# Patient Record
Sex: Female | Born: 1945 | Race: White | Hispanic: No | Marital: Single | Smoking: Former smoker
Health system: Southern US, Community
[De-identification: ages and names within clinical notes are randomized; demographics above are authoritative.]

## PROBLEM LIST (undated history)

## (undated) DIAGNOSIS — I10 Essential (primary) hypertension: Secondary | ICD-10-CM

## (undated) DIAGNOSIS — J439 Emphysema, unspecified: Secondary | ICD-10-CM

## (undated) DIAGNOSIS — M1990 Unspecified osteoarthritis, unspecified site: Secondary | ICD-10-CM

---

## 2019-03-24 ENCOUNTER — Emergency Department (HOSPITAL_COMMUNITY): Payer: Medicare Other

## 2019-03-24 ENCOUNTER — Inpatient Hospital Stay (HOSPITAL_COMMUNITY): Payer: Medicare Other

## 2019-03-24 ENCOUNTER — Inpatient Hospital Stay (HOSPITAL_COMMUNITY)
Admission: EM | Admit: 2019-03-24 | Discharge: 2019-03-30 | DRG: 871 | Disposition: E | Payer: Medicare Other | Attending: Pulmonary Disease | Admitting: Pulmonary Disease

## 2019-03-24 DIAGNOSIS — D6959 Other secondary thrombocytopenia: Secondary | ICD-10-CM | POA: Diagnosis present

## 2019-03-24 DIAGNOSIS — J440 Chronic obstructive pulmonary disease with acute lower respiratory infection: Secondary | ICD-10-CM | POA: Diagnosis present

## 2019-03-24 DIAGNOSIS — G931 Anoxic brain damage, not elsewhere classified: Secondary | ICD-10-CM | POA: Diagnosis present

## 2019-03-24 DIAGNOSIS — A419 Sepsis, unspecified organism: Principal | ICD-10-CM | POA: Diagnosis present

## 2019-03-24 DIAGNOSIS — N170 Acute kidney failure with tubular necrosis: Secondary | ICD-10-CM | POA: Diagnosis present

## 2019-03-24 DIAGNOSIS — Z791 Long term (current) use of non-steroidal anti-inflammatories (NSAID): Secondary | ICD-10-CM

## 2019-03-24 DIAGNOSIS — I5021 Acute systolic (congestive) heart failure: Secondary | ICD-10-CM | POA: Diagnosis present

## 2019-03-24 DIAGNOSIS — Z87891 Personal history of nicotine dependence: Secondary | ICD-10-CM

## 2019-03-24 DIAGNOSIS — K668 Other specified disorders of peritoneum: Secondary | ICD-10-CM

## 2019-03-24 DIAGNOSIS — I110 Hypertensive heart disease with heart failure: Secondary | ICD-10-CM | POA: Diagnosis present

## 2019-03-24 DIAGNOSIS — J80 Acute respiratory distress syndrome: Secondary | ICD-10-CM | POA: Diagnosis present

## 2019-03-24 DIAGNOSIS — J9602 Acute respiratory failure with hypercapnia: Secondary | ICD-10-CM | POA: Diagnosis not present

## 2019-03-24 DIAGNOSIS — I159 Secondary hypertension, unspecified: Secondary | ICD-10-CM | POA: Diagnosis not present

## 2019-03-24 DIAGNOSIS — D638 Anemia in other chronic diseases classified elsewhere: Secondary | ICD-10-CM | POA: Diagnosis present

## 2019-03-24 DIAGNOSIS — E872 Acidosis: Secondary | ICD-10-CM | POA: Diagnosis present

## 2019-03-24 DIAGNOSIS — J189 Pneumonia, unspecified organism: Secondary | ICD-10-CM | POA: Diagnosis not present

## 2019-03-24 DIAGNOSIS — J302 Other seasonal allergic rhinitis: Secondary | ICD-10-CM | POA: Diagnosis present

## 2019-03-24 DIAGNOSIS — Z79899 Other long term (current) drug therapy: Secondary | ICD-10-CM | POA: Diagnosis not present

## 2019-03-24 DIAGNOSIS — M170 Bilateral primary osteoarthritis of knee: Secondary | ICD-10-CM | POA: Diagnosis present

## 2019-03-24 DIAGNOSIS — Z515 Encounter for palliative care: Secondary | ICD-10-CM | POA: Diagnosis not present

## 2019-03-24 DIAGNOSIS — M069 Rheumatoid arthritis, unspecified: Secondary | ICD-10-CM

## 2019-03-24 DIAGNOSIS — R570 Cardiogenic shock: Secondary | ICD-10-CM | POA: Diagnosis present

## 2019-03-24 DIAGNOSIS — Z20828 Contact with and (suspected) exposure to other viral communicable diseases: Secondary | ICD-10-CM | POA: Diagnosis present

## 2019-03-24 DIAGNOSIS — Z66 Do not resuscitate: Secondary | ICD-10-CM | POA: Diagnosis present

## 2019-03-24 DIAGNOSIS — I469 Cardiac arrest, cause unspecified: Secondary | ICD-10-CM | POA: Diagnosis not present

## 2019-03-24 DIAGNOSIS — J14 Pneumonia due to Hemophilus influenzae: Secondary | ICD-10-CM | POA: Diagnosis present

## 2019-03-24 DIAGNOSIS — J9690 Respiratory failure, unspecified, unspecified whether with hypoxia or hypercapnia: Secondary | ICD-10-CM

## 2019-03-24 DIAGNOSIS — J9601 Acute respiratory failure with hypoxia: Secondary | ICD-10-CM | POA: Diagnosis not present

## 2019-03-24 DIAGNOSIS — I350 Nonrheumatic aortic (valve) stenosis: Secondary | ICD-10-CM | POA: Diagnosis present

## 2019-03-24 DIAGNOSIS — Z978 Presence of other specified devices: Secondary | ICD-10-CM

## 2019-03-24 DIAGNOSIS — R001 Bradycardia, unspecified: Secondary | ICD-10-CM | POA: Diagnosis present

## 2019-03-24 DIAGNOSIS — Y95 Nosocomial condition: Secondary | ICD-10-CM | POA: Diagnosis present

## 2019-03-24 DIAGNOSIS — R6521 Severe sepsis with septic shock: Secondary | ICD-10-CM | POA: Diagnosis present

## 2019-03-24 HISTORY — DX: Essential (primary) hypertension: I10

## 2019-03-24 HISTORY — DX: Emphysema, unspecified: J43.9

## 2019-03-24 HISTORY — DX: Unspecified osteoarthritis, unspecified site: M19.90

## 2019-03-24 LAB — URINALYSIS, ROUTINE W REFLEX MICROSCOPIC
Bilirubin Urine: NEGATIVE
Glucose, UA: NEGATIVE mg/dL
Ketones, ur: NEGATIVE mg/dL
Leukocytes,Ua: NEGATIVE
Nitrite: NEGATIVE
Protein, ur: 100 mg/dL — AB
RBC / HPF: >50 RBC/hpf — ABNORMAL HIGH (ref 0–5)
Specific Gravity, Urine: 1.026 (ref 1.005–1.030)
pH: 5 (ref 5.0–8.0)

## 2019-03-24 LAB — SARS CORONAVIRUS 2 BY RT PCR (HOSPITAL ORDER, PERFORMED IN ~~LOC~~ HOSPITAL LAB): SARS Coronavirus 2: NEGATIVE

## 2019-03-24 LAB — BLOOD GAS, ARTERIAL
Acid-base deficit: 19.6 mmol/L — ABNORMAL HIGH (ref 0.0–2.0)
Bicarbonate: 12.2 mmol/L — ABNORMAL LOW (ref 20.0–28.0)
Drawn by: 225631
FIO2: 100
MECHVT: 0.4 mL
O2 Saturation: 76.3 %
PEEP: 8 cmH2O
Patient temperature: 97.9
RATE: 22 resp/min
pCO2 arterial: 57.9 mmHg — ABNORMAL HIGH (ref 32.0–48.0)
pH, Arterial: 6.951 — CL (ref 7.350–7.450)
pO2, Arterial: 70.1 mmHg — ABNORMAL LOW (ref 83.0–108.0)

## 2019-03-24 LAB — BLOOD GAS, VENOUS
Acid-base deficit: 11.9 mmol/L — ABNORMAL HIGH (ref 0.0–2.0)
Bicarbonate: 12.3 mmol/L — ABNORMAL LOW (ref 20.0–28.0)
O2 Saturation: 65.7 %
Patient temperature: 98.6
pCO2, Ven: 24.4 mmHg — ABNORMAL LOW (ref 44.0–60.0)
pH, Ven: 7.321 (ref 7.250–7.430)
pO2, Ven: 42.2 mmHg (ref 32.0–45.0)

## 2019-03-24 LAB — CBC WITH DIFFERENTIAL/PLATELET
Abs Immature Granulocytes: 0.06 10*3/uL (ref 0.00–0.07)
Basophils Absolute: 0.1 10*3/uL (ref 0.0–0.1)
Basophils Relative: 1 %
Eosinophils Absolute: 0 10*3/uL (ref 0.0–0.5)
Eosinophils Relative: 0 %
HCT: 41.5 % (ref 36.0–46.0)
Hemoglobin: 12.4 g/dL (ref 12.0–15.0)
Immature Granulocytes: 1 %
Lymphocytes Relative: 4 %
Lymphs Abs: 0.3 10*3/uL — ABNORMAL LOW (ref 0.7–4.0)
MCH: 27 pg (ref 26.0–34.0)
MCHC: 29.9 g/dL — ABNORMAL LOW (ref 30.0–36.0)
MCV: 90.2 fL (ref 80.0–100.0)
Monocytes Absolute: 0.5 10*3/uL (ref 0.1–1.0)
Monocytes Relative: 6 %
Neutro Abs: 7.4 10*3/uL (ref 1.7–7.7)
Neutrophils Relative %: 88 %
Platelets: 248 10*3/uL (ref 150–400)
RBC: 4.6 MIL/uL (ref 3.87–5.11)
RDW: 16 % — ABNORMAL HIGH (ref 11.5–15.5)
WBC: 8.3 10*3/uL (ref 4.0–10.5)
nRBC: 0 % (ref 0.0–0.2)

## 2019-03-24 LAB — HEPATIC FUNCTION PANEL
ALT: 18 U/L (ref 0–44)
AST: 38 U/L (ref 15–41)
Albumin: 2.8 g/dL — ABNORMAL LOW (ref 3.5–5.0)
Alkaline Phosphatase: 139 U/L — ABNORMAL HIGH (ref 38–126)
Bilirubin, Direct: 0.7 mg/dL — ABNORMAL HIGH (ref 0.0–0.2)
Indirect Bilirubin: 0.7 mg/dL (ref 0.3–0.9)
Total Bilirubin: 1.4 mg/dL — ABNORMAL HIGH (ref 0.3–1.2)
Total Protein: 7.3 g/dL (ref 6.5–8.1)

## 2019-03-24 LAB — BASIC METABOLIC PANEL
Anion gap: 20 — ABNORMAL HIGH (ref 5–15)
BUN: 26 mg/dL — ABNORMAL HIGH (ref 8–23)
CO2: 12 mmol/L — ABNORMAL LOW (ref 22–32)
Calcium: 9.1 mg/dL (ref 8.9–10.3)
Chloride: 107 mmol/L (ref 98–111)
Creatinine, Ser: 1.01 mg/dL — ABNORMAL HIGH (ref 0.44–1.00)
GFR calc Af Amer: >60 mL/min (ref >60)
GFR calc non Af Amer: 54 mL/min — ABNORMAL LOW (ref >60)
Glucose, Bld: 131 mg/dL — ABNORMAL HIGH (ref 70–99)
Potassium: 4.2 mmol/L (ref 3.5–5.1)
Sodium: 139 mmol/L (ref 135–145)

## 2019-03-24 LAB — LACTIC ACID, PLASMA
Lactic Acid, Venous: 10.6 mmol/L (ref 0.5–1.9)
Lactic Acid, Venous: >11 mmol/L (ref 0.5–1.9)

## 2019-03-24 LAB — BRAIN NATRIURETIC PEPTIDE: B Natriuretic Peptide: >4500 pg/mL — ABNORMAL HIGH (ref 0.0–100.0)

## 2019-03-24 LAB — TROPONIN I: Troponin I: 0.25 ng/mL (ref <0.03)

## 2019-03-24 MED ORDER — SODIUM BICARBONATE 8.4 % IV SOLN
200.0000 meq | Freq: Once | INTRAVENOUS | Status: AC
  Administered 2019-03-25: 200 meq via INTRAVENOUS

## 2019-03-24 MED ORDER — ROCURONIUM BROMIDE 50 MG/5ML IV SOLN
INTRAVENOUS | Status: AC | PRN
  Administered 2019-03-24: 100 mg via INTRAVENOUS

## 2019-03-24 MED ORDER — SODIUM CHLORIDE 0.9 % IV BOLUS (SEPSIS)
1000.0000 mL | Freq: Once | INTRAVENOUS | Status: AC
  Administered 2019-03-24: 1000 mL via INTRAVENOUS

## 2019-03-24 MED ORDER — EPINEPHRINE 1 MG/10ML IJ SOSY
PREFILLED_SYRINGE | INTRAMUSCULAR | Status: AC | PRN
  Administered 2019-03-24: 0.5 mg via INTRAVENOUS

## 2019-03-24 MED ORDER — METRONIDAZOLE IN NACL 5-0.79 MG/ML-% IV SOLN
500.0000 mg | Freq: Once | INTRAVENOUS | Status: AC
  Administered 2019-03-24: 500 mg via INTRAVENOUS
  Filled 2019-03-24: qty 100

## 2019-03-24 MED ORDER — SODIUM BICARBONATE 8.4 % IV SOLN
INTRAVENOUS | Status: AC
  Administered 2019-03-25: 200 meq via INTRAVENOUS
  Filled 2019-03-24: qty 150

## 2019-03-24 MED ORDER — VANCOMYCIN HCL IN DEXTROSE 1-5 GM/200ML-% IV SOLN
1000.0000 mg | Freq: Once | INTRAVENOUS | Status: AC
  Administered 2019-03-24: 1000 mg via INTRAVENOUS
  Filled 2019-03-24: qty 200

## 2019-03-24 MED ORDER — VASOPRESSIN 20 UNIT/ML IV SOLN
0.0300 [IU]/min | INTRAVENOUS | Status: DC
  Administered 2019-03-25: 0.03 [IU]/min via INTRAVENOUS
  Filled 2019-03-24: qty 2

## 2019-03-24 MED ORDER — NOREPINEPHRINE 4 MG/250ML-% IV SOLN
0.0000 ug/min | INTRAVENOUS | Status: DC
  Filled 2019-03-24: qty 250

## 2019-03-24 MED ORDER — SODIUM CHLORIDE 0.9 % IV SOLN
2.0000 g | Freq: Once | INTRAVENOUS | Status: AC
  Administered 2019-03-24: 2 g via INTRAVENOUS
  Filled 2019-03-24: qty 2

## 2019-03-24 MED ORDER — SODIUM CHLORIDE 0.9 % IV SOLN
INTRAVENOUS | Status: DC | PRN
  Administered 2019-03-24: 500 mL via INTRAVENOUS

## 2019-03-24 MED ORDER — ETOMIDATE 2 MG/ML IV SOLN
INTRAVENOUS | Status: AC | PRN
  Administered 2019-03-24: 10 mg via INTRAVENOUS

## 2019-03-24 MED ORDER — IPRATROPIUM-ALBUTEROL 0.5-2.5 (3) MG/3ML IN SOLN
3.0000 mL | Freq: Four times a day (QID) | RESPIRATORY_TRACT | Status: DC
  Administered 2019-03-25 (×2): 3 mL via RESPIRATORY_TRACT
  Filled 2019-03-24 (×2): qty 3

## 2019-03-24 MED ORDER — MIDAZOLAM HCL 2 MG/2ML IJ SOLN: 1.0000 mg | INTRAMUSCULAR | Status: DC | PRN

## 2019-03-24 MED ORDER — SODIUM CHLORIDE 0.9 % IV SOLN: INTRAVENOUS | Status: DC | PRN

## 2019-03-24 MED ORDER — SODIUM CHLORIDE 0.9 % IV BOLUS
1000.0000 mL | Freq: Once | INTRAVENOUS | Status: AC
  Administered 2019-03-25: 1000 mL via INTRAVENOUS

## 2019-03-24 MED ORDER — FENTANYL BOLUS VIA INFUSION
25.0000 ug | INTRAVENOUS | Status: DC | PRN
  Filled 2019-03-24: qty 25

## 2019-03-24 MED ORDER — FENTANYL 2500MCG IN NS 250ML (10MCG/ML) PREMIX INFUSION
25.0000 ug/h | INTRAVENOUS | Status: DC
  Filled 2019-03-24: qty 250

## 2019-03-24 MED ORDER — ENOXAPARIN SODIUM 40 MG/0.4ML ~~LOC~~ SOLN
40.0000 mg | Freq: Every day | SUBCUTANEOUS | Status: DC
  Administered 2019-03-25: 40 mg via SUBCUTANEOUS
  Filled 2019-03-24: qty 0.4

## 2019-03-24 MED ORDER — STERILE WATER FOR INJECTION IV SOLN
INTRAVENOUS | Status: DC
  Administered 2019-03-25: via INTRAVENOUS
  Filled 2019-03-24 (×2): qty 850

## 2019-03-24 MED ORDER — FUROSEMIDE 10 MG/ML IJ SOLN
40.0000 mg | Freq: Once | INTRAMUSCULAR | Status: AC
  Administered 2019-03-25: 40 mg via INTRAVENOUS
  Filled 2019-03-24: qty 4

## 2019-03-24 MED ORDER — ACETAMINOPHEN 500 MG PO TABS
1000.0000 mg | ORAL_TABLET | Freq: Once | ORAL | Status: AC
  Administered 2019-03-24: 1000 mg via ORAL
  Filled 2019-03-24: qty 2

## 2019-03-24 MED ORDER — KETAMINE HCL 10 MG/ML IJ SOLN
INTRAMUSCULAR | Status: AC
  Filled 2019-03-24: qty 1

## 2019-03-24 MED ORDER — FENTANYL CITRATE (PF) 100 MCG/2ML IJ SOLN: 50.0000 ug | Freq: Once | INTRAMUSCULAR | Status: DC

## 2019-03-24 MED ORDER — FENTANYL CITRATE (PF) 100 MCG/2ML IJ SOLN: 25.0000 ug | Freq: Once | INTRAMUSCULAR | Status: DC

## 2019-03-24 MED ORDER — PROPOFOL 1000 MG/100ML IV EMUL: 5.0000 ug/kg/min | INTRAVENOUS | Status: DC

## 2019-03-24 MED ORDER — ATROPINE SULFATE 1 MG/ML IJ SOLN
INTRAMUSCULAR | Status: AC | PRN
  Administered 2019-03-24: 1 mg via INTRAVENOUS

## 2019-03-24 MED ORDER — ACETAMINOPHEN 650 MG RE SUPP
650.0000 mg | RECTAL | Status: DC
  Filled 2019-03-24: qty 1

## 2019-03-24 MED ORDER — NOREPINEPHRINE BITARTRATE 1 MG/ML IV SOLN
0.0000 ug/min | INTRAVENOUS | Status: DC
  Filled 2019-03-24: qty 4

## 2019-03-24 NOTE — Sedation Documentation (Signed)
Intubation attempted using glidescope 3 with ET tube 7.5 but unsucessful. Vitals: BP 75/55  HR 99. Patient is bagged by RT.

## 2019-03-24 NOTE — Progress Notes (Signed)
A consult was received from an ED physician for vancomycin and cefepime per pharmacy dosing (for an indication other than meningitis). The patient's profile has been reviewed for ht/wt/allergies/indication/available labs. A one time order has been placed for the above antibiotics.  Further antibiotics/pharmacy consults should be ordered by admitting physician if indicated.                       Bernadene Person, PharmD, BCPS [phone] 03/19/2019, 5:09 PM

## 2019-03-24 NOTE — ED Notes (Addendum)
1913- timeout  MD Bubba Camp, PA Abigail RN Verlon Au, Cari NT Sharlette Dense RSI- 10 etomidate, 100 roconium  Glidescope used,   1918- etomidate given- 10 mL flush-Taylor RN 1919 100 rox-10 mL flush Ladona Ridgel RN  1920- glidescope 3- endotracheal tube 7.5- Md Floyd 1922- BP 75/55, Hr 99 1923- HR 79; 1925- MD ordered 0.5 epi1926 give n - HR 37 BP 133/91 1927- 1 amp atropine, 0.5 epi  1929- SpO2 88; HR 77;   CPR initiated 1930  1932- 1 mg epi  1933 - unsuccessful intubation attempt 1935- 1 mg epi  1936 pulse check- negative  1935- Laryngeal mask airway placed+ 1936- BP 132/17 1937 Lucas applied, started 1938 1939- 1 mg epi 1943 NG placed, listened with stethoscope, positive placement 1944 pulse check- negative  1944- glidescope 4-ET tube placed 7.5- positive CO2 return 1946- 1 mg epi 1950 1 mg epi 1953- 100 mEq Bicarb  1954 pulse check-positive  2000- BP 122/99; HR 129

## 2019-03-24 NOTE — ED Notes (Signed)
RN notified that pt was not tolerating Bipap, This RN entered pts room to find pt pulling at mask, pl O2 sats 75 on 8 L [place].  Pt placed on NRB.  Pt O2 sats remained in low 80's.  PA Cammy Copa, MD Adela Lank made aware of pts intolerance to [place] and NRB.

## 2019-03-24 NOTE — ED Triage Notes (Signed)
Pt BIBA from [facility], c/o SHOB x 2days, productive cough (yellow sputum).  On EMS arrival to facility 89% on room air, 95% by NRB.    Hx of emphysema.    Pt received 300 mL NaCl en route

## 2019-03-24 NOTE — ED Notes (Signed)
Bed: BR49 Expected date:  Expected time:  Means of arrival:  Comments: EMS 74 yo SHOB, increased RR, pna?, sepsis

## 2019-03-24 NOTE — H&P (Addendum)
..   NAME:  Claire Flores, MRN:  [id number], DOB:  June 28, 1946, LOS: 0 ADMISSION DATE:  03/01/2019, CONSULTATION DATE: 03/19/2019 REFERRING MD:  Adela Lank MD, CHIEF COMPLAINT:  Post Cardiac arrest   Brief History   73 year old female w/ PMHx sig Emphysema, RA, presents to [facility] long ED with acute shortness of breath increased respiratory rate during intubation.  During this procedure patient went into cardiac arrest for a total of 22 minutes.  PCCM consulted for admission.  History of present illness   (History obtained from EMR and the account of other providers as patient is encephalopathic)  Daughters were at bedside earlier [facility] to daughter Claire Flores ([phone]) Pt at baseline is very lucent and used to lived in [place] on her own in an apt her PMD was at [facility]. In Jan pt had a h/o a fall down for 20 hrs between bed and dresser Jan 2020 she was admitted. At that time they found a small spot on her lung ( pulm nodule vs infectious process). She was discharged and eventually placed into ALF when it was found that she was unable to take care of herself anymore.  Sunday May 24 th>>daughter reports that the patient told her that her left foot was swollen and she had a cough. Daughter stated that she also lost her voice which has happened before. Daughter eventually went to [facility] and when she saw her mother at that time she noted she was altered/confused. Shortly after she was sent to the hospital via EMS  73 year old female from [facility](asst living facility) with past medical history sig for Emphysema, Rheumatoid Arthritis, HTN presented to [facility], ED at 4 PM on 5/26 with complaints of shortness of breath brought in by EMS patient was noted to have increased work of breathing, w/ productive sputum initially on NRB given empiric antibiotics. Around 6:30 PM pt was started on NIPPV but was unable to tolerate and decision was made to intubate.  Per ED provider's documentation  pt received RSI for intubation and became bradycardic during procedure. Pt received atropine and .5 epinephrine and 3 mins later lost pulses CPR initiated. Compressions delivered with Samuel Bouche and pt was intubated successfully during code (Total of 5 epi and 2 bicarb>> 22 mins total duration). PCCM consulted for admission.  Past Medical History  Per daughter Rheumatoid Arthritis Osteoarthritis in both knees ( no surgical intervention) Per daughter >10 yrs early stages of COPD  >10 years smoking cessation Seasonal allergies H/o Hypertension was prev on Rx and post loosing 30lbs weight she no longer needed medication   Significant Hospital Events   Endotracheal intubation>>>>> 02/27/2019 Cardiac arrest>>>>>>>>>>>03/27/2019  Consults:  PCCM >>>>>03/09/2019  Procedures:  Endotracheal intubation>>>>> 03/21/2019  Significant Diagnostic Tests:  LA 10 BNP 4500 Trop 0.25 AG 20 WBC 8.3 Hgb 12 Hct 41 Plts 248 Neutrophils 88 .Marland Kitchen ABG ( post code)    Component Value Date/Time   PHART 6.951 (LL) 03/18/2019 2026   PCO2ART 57.9 (H) 03/11/2019 2026   PO2ART 70.1 (L) 03/07/2019 2026   HCO3 12.2 (L) 03/09/2019 2026   ACIDBASEDEF 19.6 (H) 03/23/2019 2026   O2SAT 76.3 03/16/2019 2026   HK:FEXMDY in appearance w. 100+ protein, >50 RBC in HPF hyaline and mucus casts present Many bacteria seen negative for both nitrite and leuk  IMAGING: DG ABD FINDINGS: Scattered large and small bowel gas is noted. The gastric catheter is stable and again should be advanced. No free intraperitoneal air is noted.  CXR FINDINGS:  An endotracheal tube is in place with tip 6.1 cm above the carina. A nasogastric tube is seen extending into the stomach, however, the tip of the nasogastric tube extends below the lower margin of the image. Transcutaneous defibrillator pad projecting over the left hemithorax. Extensive bilateral airspace consolidation with significantly worsened aeration compared to the prior examination,  particularly in the right lung. Moderate left pleural effusion. No definite right pleural effusion. Pulmonary vasculature is obscured. Mild-to-moderate cardiomegaly. Aortic atherosclerosis  EKG on admission : Vent. rate 112 BPM QRS duration 104 ms QT/QTc 338/462 ms  Sinus tachycardia Probable left atrial enlargement LVH with secondary repolarization abnormality Anterior Q waves, possibly due to LVH Micro Data:  SARSCOV2 negative >>>>>>>06-25-2019 Blood cx x 2 >>>>>>>>>>>>> 06-25-2019  Antimicrobials:  Vancomycin >>>>>>>06-25-2019 Cefepime>>>>>>> 06-25-2019   Objective   Blood pressure 91/66, pulse (!) 114, temperature 97.7 F (36.5 C), resp. rate 18, weight 61.5 kg, SpO2 94 %.    Vent Mode: PRVC FiO2 (%):  [100 %] 100 % Set Rate:  [22 bmp] 22 bmp Vt Set:  [400 mL] 400 mL PEEP:  [8 cmH20] 8 cmH20 Plateau Pressure:  [26 cmH20] 26 cmH20   Intake/Output Summary (Last 24 hours) at 06-25-2019 2133 Last data filed at 06-25-2019 2125 Gross per 24 hour  Intake 6100 ml  Output 5 ml  Net 6095 ml   Filed Weights   August 01, 2019 1816  Weight: 61.5 kg    Examination: General: intubated unresponsive not on continuous sedation HENT: normocephalic, small jaw, ETT in oropharynx Lungs: diffuse crackles + rhonci Cardiovascular: S1 and S2 appreciated displaced PMI Abdomen: soft non distended non tender + BS Extremities: + 3 pitting edema up to the abdomen + anasaca. Hands and arms + nodules and deformity Neuro: sluggish pupils, no gag, no withdrawal from painful stimuli GU: indwelling foley Skin: intact + livedo reticularis of lower ext   Assessment & Plan:  S/p Cardiac arrest ( PEA) 22 mins downtime, good quality CPR Plan: Admit to ICU Post resuscitative care Normothermia  Will need a TTE to evaluated LVEF Trend troponins Admission EKG showed   Acute Encephalopathy AMS on admission Shortly after Post code poor neuro exam Not requiring sedation Plan: Continue off sedation  Neurochecks If pt wakes up will start on sedation protocol w/ RASS goal 0 to -1 Given h/o RA post intubation w/ difficult airway may warrant CTneck CTH w/o contrast  Septic vs Cardiogenic shock Most likely sources: multifocal pneumonia Started on empiric abx 30cc/kg is 1800cc IVF>>  Plan: Check MVO2 and CVP Currently on Levophed post code would start diuresis once  MAP goal >1465mmHG CVC placement by EDP Pt has a h/o RA may have been on steroid Rx check cortisol Follow-up cultures trend WBC and fever curve Continue on broad-spectrum antibiotics  Acute on Chronic Respiratory Failure Hypoxia and hypercarbia H/o Emphysema  And CXR suggestive of Multifocal Pneumonia Endotracheally intubated Negative for SARSCOV2 PF ratio: 70 Plan: Continue on droplet precautions Check RVP and trach cx Continue empiric abx TV 6cc/kg ARDS protocol Continue on full vent support VAPrecautions Bronchodilators Q 6  H/o HTN, no prev diagnosis of CHF BNP 4500>> fluid overload may be secondary to acute systolic heart failure LVH and Q waves on EKG suggestive of longstanding HTN and possible old infarct Plan: 2D echo to assess LVEF bedised CCM echo showed EF <30% no pericardial effusion  IVC 2.11 not varying with resp Continue indwelling foley and start diuresis Trend troponin  Anion Gap Metabolic Acidosis Secondary to elevated  Lactic acid May be due to increased work of breathing GFR 54 Plan: Start on Bicarb due to deficit and ph <7 Monitor UOP Hold IVF due to BNP 4500  Best practice:  Diet: NPO Pain/Anxiety/Delirium protocol (if indicated): Currently holding for neurological assessment.  Patient's neuro status improves will start fentanyl and versed VAP protocol (if indicated): yes DVT prophylaxis: lovenox GI prophylaxis: protonix Glucose control: if BG >180 will start on ISS Mobility: bedrest  Code Status: Limited no compressions no defibrillation.  Family Communication: pt came from  [facility] NOK is Claire MunroLisa Flores [phone]. We had a thorough conversation regarding current clinical condition, PMHx and GOC. Decision made to make pt a limited code. Will continue current care but if pt has a subsequent cardiac arrest no compressions no defibrillation.  Disposition: ICU   Labs   CBC: Recent Labs  Lab 11-08-18 1627  WBC 8.3  NEUTROABS 7.4  HGB 12.4  HCT 41.5  MCV 90.2  PLT 248    Basic Metabolic Panel: Recent Labs  Lab 11-08-18 1627  NA 139  K 4.2  CL 107  CO2 12*  GLUCOSE 131*  BUN 26*  CREATININE 1.01*  CALCIUM 9.1   GFR: CrCl cannot be calculated (Unknown ideal weight.). Recent Labs  Lab 11-08-18 1627 11-08-18 2010  WBC 8.3  --   LATICACIDVEN 10.6* >11.0*    Liver Function Tests: Recent Labs  Lab 11-08-18 1626  AST 38  ALT 18  ALKPHOS 139*  BILITOT 1.4*  PROT 7.3  ALBUMIN 2.8*   No results for input(s): LIPASE, AMYLASE in the last 168 hours. No results for input(s): AMMONIA in the last 168 hours.  ABG    Component Value Date/Time   PHART 6.951 (LL) 11-08-2018 2026   PCO2ART 57.9 (H) 11-08-2018 2026   PO2ART 70.1 (L) 11-08-2018 2026   HCO3 12.2 (L) 11-08-2018 2026   ACIDBASEDEF 19.6 (H) 11-08-2018 2026   O2SAT 76.3 11-08-2018 2026     Coagulation Profile: No results for input(s): INR, PROTIME in the last 168 hours.  Cardiac Enzymes: Recent Labs  Lab 11-08-18 1627  TROPONINI 0.25*    HbA1C: No results found for: HGBA1C  CBG: No results for input(s): GLUCAP in the last 168 hours.  Review of Systems:   Marland Kitchen.Marland Kitchen.Review of Systems  Unable to perform ROS: Intubated     Past Medical History  She,  has no past medical history on file.   Surgical History   None per daughter   Social History     H/o smoking >10 years ago Family History   Her family history is not on file.   Allergies No Known Allergies   Home Medications  Prior to Admission medications   Medication Sig Start Date  End Date Taking? Authorizing Provider  amLODipine (NORVASC) 5 MG tablet Take 5 mg by mouth daily.   Yes [provider]  meloxicam (MOBIC) 7.5 MG tablet Take 7.5 mg by mouth daily as needed for pain.   Yes [provider]  triamcinolone cream (KENALOG) 0.1 % Apply 1 application topically 2 (two) times daily as needed (itching and rash).   Yes [provider]   STAFF NOTE  I, Dr Newell CoralKristen Scatliffe have personally reviewed patient's available data, including medical history, events of note, physical examination and test results as part of my evaluation. I have discussed with EDP and other care providers such as pharmacist, RN and Elink.  In addition,  I personally evaluated patient  The patient is critically ill with multiple organ systems failure and requires high complexity decision making for assessment and support, frequent evaluation and titration of therapies, application of advanced monitoring technologies and extensive interpretation of multiple databases.   Critical Care Time devoted to patient care services described in this note is  70 Minutes. This time reflects time of care of this signee Dr Newell Coral. This critical care time does not reflect procedure time, or teaching time or supervisory time  but could involve care discussion time    Dr. Newell Coral Pulmonary Critical Care Medicine  2019/04/01 10:16 PM    Critical care time: 70 mins

## 2019-03-24 NOTE — ED Notes (Signed)
RT notified of BIPAP order

## 2019-03-24 NOTE — ED Provider Notes (Signed)
[facility]-EMERGENCY DEPT Provider Note   CSN: [id number] Arrival date & time: 03/07/2019  1606    History   Chief Complaint Chief Complaint  Patient presents with  . Shortness of Breath    HPI Claire Flores is a [age] y.o. female brought in by EMS with complaint of shortness of breath.  Is a level 5 caveat due to acuity of condition.  History is gathered by EMS and conversation via phone with the patient's daughter.  According to EMS the patient was febrile.  They report a history of COPD however the daughter states that she does not have one.  [facility] which is an [facility] living facility for hypoxia and shortness of breath.  Patient was found to be in moderate respiratory distress with hypoxia of about 89%.  She was placed on 15 L via nonrebreather with improvement in her saturations however she had persistent tachypnea into the 30s and 40s prior to arrival.  According to the patient's daughter she does have a past medical history of rheumatoid arthritis but is not currently taking any DMARDs.  Her daughter reports no other significant history.  She uses a walker to walk.  She takes no other medications.  Upon evaluation the patient was found to have marketed lower extremity edema and she thinks it is been present for about 2 weeks.  She also states that she has had shortness of breath since yesterday.  Daughter states that normally she has no confusion and they communicate via text.  Her daughter states that she reported having a cold and swelling to the left foot a couple of days ago.  Today the daughter received a text from her mother that was gibberish and she became concerned calling the facility.  She was told that they were waiting for the doctor to evaluate her.  She and her mother have not had end-of-life care planning.  She feels that her mother would want to be full code should she become severely ill.     HPI  No past medical  history on file.  There are no active problems to display for this patient.    The histories are not reviewed yet. Please review them in the "History" navigator section and refresh this SmartLink.   OB History   No obstetric history on file.      Home Medications    Prior to Admission medications   Medication Sig Start Date End Date Taking? Authorizing Provider  amLODipine (NORVASC) 5 MG tablet Take 5 mg by mouth daily.   Yes [provider]  meloxicam (MOBIC) 7.5 MG tablet Take 7.5 mg by mouth daily as needed for pain.   Yes [provider]  triamcinolone cream (KENALOG) 0.1 % Apply 1 application topically 2 (two) times daily as needed (itching and rash).   Yes [provider]    Family History No family history on file.  Social History Social History   Tobacco Use  . Smoking status: Not on file  Substance Use Topics  . Alcohol use: Not on file  . Drug use: Not on file     Allergies   Patient has no known allergies.   Review of Systems Review of Systems  Unable to review systems due to acuity of condition Physical Exam Updated Vital Signs BP 102/70   Pulse (!) 110   Resp (!) 29   SpO2 100%   Physical Exam Vitals signs and nursing note  reviewed.  Constitutional:      Appearance: She is ill-appearing and toxic-appearing.  HENT:     Head: Normocephalic and atraumatic.  Eyes:     Extraocular Movements: Extraocular movements intact.     Pupils: Pupils are equal, round, and reactive to light.  Cardiovascular:     Rate and Rhythm: Tachycardia present.  Pulmonary:     Effort: Tachypnea present. No accessory muscle usage.     Breath sounds: Examination of the right-upper field reveals rhonchi. Examination of the left-upper field reveals rhonchi. Examination of the right-middle field reveals rhonchi. Examination of the left-middle field reveals decreased breath sounds and rhonchi. Examination of the right-lower field reveals rhonchi.  Examination of the left-lower field reveals decreased breath sounds. Decreased breath sounds and rhonchi present.     Comments: Tinny, high pitched, hypreresonant ronchi in the RLL Musculoskeletal:     Right lower leg: Edema present.     Left lower leg: Edema present.     Comments: 3+ pitting edema to the naval  Anasarca Deformities of the hands wrist and elbows consistent with RA.  Skin:    Comments: Ulcer on the left middle toe  Neurological:     Mental Status: She is alert.      ED Treatments / Results  Labs (all labs ordered are listed, but only abnormal results are displayed) Labs Reviewed  BLOOD GAS, VENOUS - Abnormal; Notable for the following components:      Result Value   pCO2, Ven 24.4 (*)    Bicarbonate 12.3 (*)    Acid-base deficit 11.9 (*)    All other components within normal limits  SARS CORONAVIRUS 2 (HOSPITAL ORDER, PERFORMED IN [facility])  CULTURE, BLOOD (ROUTINE X 2)  CULTURE, BLOOD (ROUTINE X 2)  BASIC METABOLIC PANEL  BRAIN NATRIURETIC PEPTIDE  CBC WITH DIFFERENTIAL/PLATELET  TROPONIN I  LACTIC ACID, PLASMA  URINALYSIS, ROUTINE W REFLEX MICROSCOPIC  HEPATIC FUNCTION PANEL    EKG EKG Interpretation  Date/Time:  Tuesday Mar 24 2019 16:27:44 EDT Ventricular Rate:  112 PR Interval:    QRS Duration: 104 QT Interval:  338 QTC Calculation: 462 R Axis:   -11 Text Interpretation:  Sinus tachycardia Probable left atrial enlargement LVH with secondary repolarization abnormality Anterior Q waves, possibly due to LVH No old tracing to compare Confirmed by Melene PlanFloyd, Dan [phone]([id number]) on 03/22/2019 4:54:37 PM   Radiology No results found.  Procedures .Critical Care Performed by: Arthor CaptainHarris, Maame Dack, PA-C Authorized by: Arthor CaptainHarris, Morris Longenecker, PA-C   Critical care provider statement:    Critical care time (minutes):  120   Critical care was necessary to treat or prevent imminent or life-threatening deterioration of the following conditions:  Respiratory  failure, cardiac failure, circulatory failure, CNS failure or compromise, sepsis and shock   Critical care was time spent personally by me on the following activities:  Discussions with consultants, evaluation of patient's response to treatment, examination of patient, ordering and performing treatments and interventions, ordering and review of laboratory studies, ordering and review of radiographic studies, pulse oximetry, re-evaluation of patient's condition, obtaining history from patient or surrogate, review of old charts and blood draw for specimens   (including critical care time)  Medications Ordered in ED Medications  ceFEPIme (MAXIPIME) 2 g in sodium chloride 0.9 % 100 mL IVPB (has no administration in time range)  metroNIDAZOLE (FLAGYL) IVPB 500 mg (has no administration in time range)  vancomycin (VANCOCIN) IVPB 1000 mg/200 mL premix (has no administration in time range)  sodium chloride 0.9 % bolus 1,000 mL (has no administration in time range)    And  sodium chloride 0.9 % bolus 1,000 mL (has no administration in time range)  acetaminophen (TYLENOL) tablet 1,000 mg (1,000 mg Oral Given Apr 07, 2019 1646)     Initial Impression / Assessment and Plan / ED Course  I have reviewed the triage vital signs and the nursing notes.  Pertinent labs & imaging results that were available during my care of the patient were reviewed by me and considered in my medical decision making (see chart for details).  Clinical Course as of Mar 23 1825  Tue 04-07-19  1739 Elevated troponin likely secondary to demand  Troponin I(!!): 0.25 [AH]  1739 Lactic Acid, Venous(!!): 10.6 [AH]  1739 BUN(!): 26 [AH]  1739 Creatinine(!): 1.01 [AH]  1740 Anion gap of 20 likely secondary to her high lactic acidosis  Anion gap(!): 20 [AH]  1823 Patient BUN markedly elevated  B Natriuretic Peptide(!): >4,500.0 [AH]  1824 Patient's white blood cell count is not markedly elevated  WBC: 8.3 [AH]  1824 Negative  COVID-19 test  SARS Coronavirus 2 (CEPHEID- Performed in [facility] hospital lab), [facility] Order [AH]  [phone] Patient continues to be tachypneic however she is maintaining oxygen saturations 100% on 4 L via nasal cannula at this time.  She appears somewhat more comfortable.   [AH]    Clinical Course User Index [AH] Arthor Captain, PA-C       72 year old female who presents with respiratory distress.  Patient was persistently tachypneic.  Sepsis protocol started immediately along with COVID to testing which showed to be negative.  Patient's lactic acid highly elevated at 10.6.  Review of labs also shows a BNP greater than 4500.  CBC does not show elevated white blood cell count.  Patient UA shows infection.  Review of chest x-ray shows multifocal pneumonia and large left-sided pleural effusion.  Patient was started on a trial of BiPAP after her coronavirus test returned negative.Marland Kitchen She was unable to tolerate that and with fluid resuscitation for her sepsis may have had some worsening of her pulmonary edema.  Patient placed back on 15 L via NRB with acute respiratory failure and we confirmed with patient that she was at the point of need for intubation, which she agreed.  During intubation who were unable to initially secure the airway.  Patient had episode of bradycardia which was treated with epinephrine, atropine with improvement in heart rate but resultant loss of pulses likely secondary to hypoxia and poor reserve in the setting of sepsis.  He performed approximately 20 to 25 minutes of CPR with resultant return of spontaneous circulation and we were able to secure the patient's airway with a ET tube.  An ET tube were confirmed with repeat chest x-ray however there was concern for potential stomach fracture given apparent free air.  Repeat lateral decubitus x-ray showed no free air in the abdomen.  At the request of Dr. Carlota Raspberry the intensivist, after fluid and I placed a right IJ central line.  We had a  long discussion with the patient's daughter about the severity of her illness.  They understand that she is critically ill.  The patient will be admitted to the ICU.  She has been given some broad-spectrum antibiotics and fluid resuscitation.  She is currently maintaining her pressures on Levophed. Final Clinical Impressions(s) / ED Diagnoses   Final diagnoses:  None    ED Discharge Orders    None  Arthor Captain, PA-C 03/26/19 1613    Melene Plan, DO 03/27/19 [phone]

## 2019-03-24 NOTE — ED Provider Notes (Signed)
Procedure Name: Intubation Date/Time: 03/07/2019 11:02 PM Performed by: Deno Etienne, DO Pre-anesthesia Checklist: Patient identified, Patient being monitored, Emergency Drugs available, Timeout performed and Suction available Oxygen Delivery Method: Non-rebreather mask Preoxygenation: Pre-oxygenation with 100% oxygen Induction Type: Rapid sequence Ventilation: Mask ventilation without difficulty LMA: LMA inserted LMA Size: 4.0 Laryngoscope Size: Glidescope, Mac and 2 Grade View: Grade I Tube size: 7.5 mm Number of attempts: 5 or more Airway Equipment and Method: Video-laryngoscopy Placement Confirmation: ETT inserted through vocal cords under direct vision,  CO2 detector and Breath sounds checked- equal and bilateral Secured at: 21 cm Tube secured with: ETT holder Dental Injury: Teeth and Oropharynx as per pre-operative assessment  Difficulty Due To: Difficulty was anticipated, Difficult Airway- due to limited oral opening, Difficult Airway- due to anterior larynx, Difficult Airway- due to dentition and Difficult Airway- due to reduced neck mobility Future Recommendations: Recommend- awake intubation    .Central Line Date/Time: 03/02/2019 11:03 PM Performed by: Deno Etienne, DO Authorized by: Deno Etienne, DO   Consent:    Consent obtained:  Emergent situation   Consent given by:  Healthcare agent   Risks discussed:  Arterial puncture, incorrect placement, bleeding, infection, pneumothorax and nerve damage   Alternatives discussed:  No treatment, delayed treatment, alternative treatment and observation Pre-procedure details:    Hand hygiene: Hand hygiene performed prior to insertion     Sterile barrier technique: All elements of maximal sterile technique followed     Skin preparation:  2% chlorhexidine   Skin preparation agent: Skin preparation agent completely dried prior to procedure   Sedation:    Sedation type:  Moderate (conscious) sedation Anesthesia (see MAR for exact  dosages):    Anesthesia method:  Local infiltration   Local anesthetic:  Lidocaine 1% w/o epi Procedure details:    Location:  R internal jugular   Patient position:  Trendelenburg   Procedural supplies:  Triple lumen   Catheter size:  7.5 Fr   Landmarks identified: yes     Ultrasound guidance: yes     Sterile ultrasound techniques: Sterile gel and sterile probe covers were used     Number of attempts:  2   Successful placement: yes   Post-procedure details:    Post-procedure:  Dressing applied and line sutured   Assessment:  Blood return through all ports, free fluid flow, no pneumothorax on x-ray and placement verified by x-ray   Patient tolerance of procedure:  Tolerated well, no immediate complications   73 yo F with a chief complaint shortness of breath.  Going on for the past few days.  Patient is in significant distress upon arrival to the ED.  She improved somewhat mildly with nonrebreather, chest x-ray concerning for multifocal pneumonia versus fluid overload.  She has diffuse edema to her lower extremities.  As the patient was in the ED her course worsened she had a trial of BiPAP which she failed and then became hypoxic into the 70s despite nonrebreather.  Decision was made to intubate the patient.  Unfortunately she was a difficult airway had a subsequent cardiac arrest likely due to hypoxia.  An LMA was placed.  During a positive and cardiac arrest she was intubated with a Miller 2 blade.  After approximately 25 minutes of CPR the patient had return of spontaneous circulation.  She remained hypotensive and was started on Levophed.  There was some concern for intraperitoneal air and so a lateral decubitus film was obtained that was negative.  She was covered with broad-spectrum antibiotics.  We had a long discussion with the family about the patient's poor prognosis.  ICU admit.  CRITICAL CARE Performed by: Cecilio Asper   Total critical care time: 80 minutes  Critical  care time was exclusive of separately billable procedures and treating other patients.  Critical care was necessary to treat or prevent imminent or life-threatening deterioration.  Critical care was time spent personally by me on the following activities: development of treatment plan with patient and/or surrogate as well as nursing, discussions with consultants, evaluation of patient's response to treatment, examination of patient, obtaining history from patient or surrogate, ordering and performing treatments and interventions, ordering and review of laboratory studies, ordering and review of radiographic studies, pulse oximetry and re-evaluation of patient's condition.    Deno Etienne, DO 02/27/2019 2315

## 2019-03-24 NOTE — ED Notes (Addendum)
ED TO INPATIENT HANDOFF REPORT  ED Nurse Name and Phone #: Sharene Skeans RN [phone]  S Name/Age/Gender Claire Flores 73 y.o. female Room/Bed: WA12/WA12  Code Status   Code Status: Full Code  Home/SNF/Other Nursing facility Patient oriented to: none Is this baseline? No  Triage Complete: Triage complete  Chief Complaint Sepsis  Triage Note Pt BIBA from [facility], c/o SHOB x 2days, productive cough (yellow sputum).  On EMS arrival to facility 89% on room air, 95% by NRB.    Hx of emphysema.    Pt received 300 mL NaCl en route     Allergies No Known Allergies  Level of Care/Admitting Diagnosis ED Disposition    ED Disposition Condition Comment   Admit  Hospital Area: [facility] [[id number]]  Level of Care: ICU [6]  Covid Evaluation: N/A  Diagnosis: Cardiac arrest (HCC) [427.5.ICD-9-CM]  Admitting Physician: Carin Hock [[id number]]  Attending Physician: Carin Hock [[id number]]  Estimated length of stay: > 1 week  Certification:: I certify this patient will need inpatient services for at least 2 midnights  PT Class (Do Not Modify): Inpatient [101]  PT Acc Code (Do Not Modify): Private [1]       B Medical/Surgery History No past medical history on file.    A IV Location/Drains/Wounds Patient Lines/Drains/Airways Status   Active Line/Drains/Airways    Name:   Placement date:   Placement time:   Site:   Days:   Peripheral IV Apr 01, 2019 Right Antecubital   April 01, 2019    1729    Antecubital   less than 1   Peripheral IV 04/01/2019 Left External jugular   2019/04/01    2017    External jugular   less than 1   Urethral Catheter Samantha, NT  Temperature probe;Latex 14 Fr.   Apr 01, 2019    2015    Temperature probe;Latex   less than 1   Airway 7.5 mm   04-01-19    2000     less than 1          Intake/Output Last 24 hours  Intake/Output Summary (Last 24 hours) at April 01, 2019 2250 Last data filed at April 01, 2019 2125 Gross per 24 hour  Intake  6100 ml  Output 10 ml  Net 6090 ml    Labs/Imaging Results for orders placed or performed during the hospital encounter of 2019/04/01 (from the past 48 hour(s))  Hepatic function panel     Status: Abnormal   Collection Time: 04-01-19  4:26 PM  Result Value Ref Range   Total Protein 7.3 6.5 - 8.1 g/dL   Albumin 2.8 (L) 3.5 - 5.0 g/dL   AST 38 15 - 41 U/L   ALT 18 0 - 44 U/L   Alkaline Phosphatase 139 (H) 38 - 126 U/L   Total Bilirubin 1.4 (H) 0.3 - 1.2 mg/dL   Bilirubin, Direct 0.7 (H) 0.0 - 0.2 mg/dL   Indirect Bilirubin 0.7 0.3 - 0.9 mg/dL    Comment: Performed at [facility], [address], [place], [place] [address]  SARS Coronavirus 2 (CEPHEID- Performed in [facility] hospital lab), Hosp Order     Status: None   Collection Time: 04-01-19  4:27 PM  Result Value Ref Range   SARS Coronavirus 2 NEGATIVE NEGATIVE    Comment: (NOTE) If result is NEGATIVE SARS-CoV-2 target nucleic acids are NOT DETECTED. The SARS-CoV-2 RNA is generally detectable in upper and lower  respiratory specimens during the acute phase of infection. The  lowest  concentration of SARS-CoV-2 viral copies this assay can detect is 250  copies / mL. A negative result does not preclude SARS-CoV-2 infection  and should not be used as the sole basis for treatment or other  patient management decisions.  A negative result may occur with  improper specimen collection / handling, submission of specimen other  than nasopharyngeal swab, presence of viral mutation(s) within the  areas targeted by this assay, and inadequate number of viral copies  (<250 copies / mL). A negative result must be combined with clinical  observations, patient history, and epidemiological information. If result is POSITIVE SARS-CoV-2 target nucleic acids are DETECTED. The SARS-CoV-2 RNA is generally detectable in upper and lower  respiratory specimens dur ing the acute phase of infection.  Positive  results are indicative  of active infection with SARS-CoV-2.  Clinical  correlation with patient history and other diagnostic information is  necessary to determine patient infection status.  Positive results do  not rule out bacterial infection or co-infection with other viruses. If result is PRESUMPTIVE POSTIVE SARS-CoV-2 nucleic acids MAY BE PRESENT.   A presumptive positive result was obtained on the submitted specimen  and confirmed on repeat testing.  While 2019 novel coronavirus  (SARS-CoV-2) nucleic acids may be present in the submitted sample  additional confirmatory testing may be necessary for epidemiological  and / or clinical management purposes  to differentiate between  SARS-CoV-2 and other Sarbecovirus currently known to infect humans.  If clinically indicated additional testing with an alternate test  methodology [id number]) is advised. The SARS-CoV-2 RNA is generally  detectable in upper and lower respiratory sp ecimens during the acute  phase of infection. The expected result is Negative. Fact Sheet for Patients:  BoilerBrush.com.cyhttps://www.fda.gov/media/136312/download Fact Sheet for Healthcare Providers: https://pope.com/https://www.fda.gov/media/136313/download This test is not yet approved or cleared by the [place] FDA and has been authorized for detection and/or diagnosis of SARS-CoV-2 by FDA under an Emergency Use Authorization (EUA).  This EUA will remain in effect (meaning this test can be used) for the duration of the COVID-19 declaration under Section 564(b)(1) of the Act, 21 U.S.C. section 360bbb-3(b)(1), unless the authorization is terminated or revoked sooner. Performed at [facility], [address] [place], [place] [address]   Basic metabolic panel     Status: Abnormal   Collection Time: 09/09/19  4:27 PM  Result Value Ref Range   Sodium 139 135 - 145 mmol/L   Potassium 4.2 3.5 - 5.1 mmol/L   Chloride 107 98 - 111 mmol/L   CO2 12 (L) 22 - 32 mmol/L   Glucose, Bld 131 (H) 70 -  99 mg/dL   BUN 26 (H) 8 - 23 mg/dL   Creatinine, Ser 8.111.01 (H) 0.44 - 1.00 mg/dL   Calcium 9.1 8.9 - 91.410.3 mg/dL   GFR calc non Af Amer 54 (L) >60 mL/min   GFR calc Af Amer >60 >60 mL/min   Anion gap 20 (H) 5 - 15    Comment: Performed at [facility], [address] [place], [place] [address]  Brain natriuretic peptide     Status: Abnormal   Collection Time: 09/09/19  4:27 PM  Result Value Ref Range   B Natriuretic Peptide >4,500.0 (H) 0.0 - 100.0 pg/mL    Comment: Performed at [facility], [address] [place], [place] [address]  CBC with Differential     Status: Abnormal   Collection Time: 09/09/19  4:27 PM  Result  Value Ref Range   WBC 8.3 4.0 - 10.5 K/uL    Comment: WHITE COUNT CONFIRMED ON SMEAR   RBC 4.60 3.87 - 5.11 MIL/uL   Hemoglobin 12.4 12.0 - 15.0 g/dL   HCT 16.141.5 09.636.0 - 04.546.0 %   MCV 90.2 80.0 - 100.0 fL   MCH 27.0 26.0 - 34.0 pg   MCHC 29.9 (L) 30.0 - 36.0 g/dL   RDW 40.916.0 (H) 81.111.5 - 91.415.5 %   Platelets 248 150 - 400 K/uL   nRBC 0.0 0.0 - 0.2 %   Neutrophils Relative % 88 %   Neutro Abs 7.4 1.7 - 7.7 K/uL   Lymphocytes Relative 4 %   Lymphs Abs 0.3 (L) 0.7 - 4.0 K/uL   Monocytes Relative 6 %   Monocytes Absolute 0.5 0.1 - 1.0 K/uL   Eosinophils Relative 0 %   Eosinophils Absolute 0.0 0.0 - 0.5 K/uL   Basophils Relative 1 %   Basophils Absolute 0.1 0.0 - 0.1 K/uL   WBC Morphology MILD LEFT SHIFT (1-5% METAS, OCC MYELO, OCC BANDS)    Immature Granulocytes 1 %   Abs Immature Granulocytes 0.06 0.00 - 0.07 K/uL    Comment: Performed at [facility], [address] [place], [place] [address]  Troponin I - ONCE - STAT     Status: Abnormal   Collection Time: 2019/04/26  4:27 PM  Result Value Ref Range   Troponin I 0.25 (HH) <0.03 ng/mL    Comment: CRITICAL RESULT CALLED TO, READ BACK BY AND VERIFIED WITH: Philomena DohenyM BRILL,RN Aug 25, 2019 1729 RHOLMES Performed at [facility], [address]  [place], [place] [address]   Lactic acid, plasma     Status: Abnormal   Collection Time: 2019/04/26  4:27 PM  Result Value Ref Range   Lactic Acid, Venous 10.6 (HH) 0.5 - 1.9 mmol/L    Comment: CRITICAL RESULT CALLED TO, READ BACK BY AND VERIFIED WITH: Philomena DohenyM BRILL,RN Aug 25, 2019 1711 RHOLMES Performed at [facility], [address] [place], [place] [address]   Blood gas, venous     Status: Abnormal   Collection Time: 2019/04/26  4:41 PM  Result Value Ref Range   pH, Ven 7.321 7.250 - 7.430   pCO2, Ven 24.4 (L) 44.0 - 60.0 mmHg   pO2, Ven 42.2 32.0 - 45.0 mmHg   Bicarbonate 12.3 (L) 20.0 - 28.0 mmol/L   Acid-base deficit 11.9 (H) 0.0 - 2.0 mmol/L   O2 Saturation 65.7 %   Patient temperature 98.6    Collection site VEIN    Drawn by DRAWN BY RN    Sample type VENOUS     Comment: Performed at [facility], [address] [place], [place] [address]  Lactic acid, plasma     Status: Abnormal   Collection Time: 2019/04/26  8:10 PM  Result Value Ref Range   Lactic Acid, Venous >11.0 (HH) 0.5 - 1.9 mmol/L    Comment: CRITICAL RESULT CALLED TO, READ BACK BY AND VERIFIED WITH: J Deniz Hannan,RN Aug 25, 2019 2043 RHOLMES Performed at [facility], [address] [place], [place] [address]   Blood gas, arterial     Status: Abnormal   Collection Time: 2019/04/26  8:26 PM  Result Value Ref Range   FIO2 100.00    Delivery systems VENTILATOR    Mode PRESSURE REGULATED VOLUME CONTROL    VT 0.400 mL   LHR 22 resp/min   Peep/cpap 8.0 cm H20   pH, Arterial  6.951 (LL) 7.350 - 7.450    Comment: CRITICAL RESULT CALLED TO, READ BACK BY AND VERIFIED WITH: DAN FLOYD, DO AT 2040 BY PATRICK SWEENEY RRT, RCP ON Mar 27, 2019    pCO2 arterial 57.9 (H) 32.0 - 48.0 mmHg   pO2, Arterial 70.1 (L) 83.0 - 108.0 mmHg   Bicarbonate 12.2 (L) 20.0 - 28.0 mmol/L   Acid-base deficit 19.6 (H) 0.0 - 2.0 mmol/L   O2 Saturation 76.3 %   Patient temperature 97.9    Collection site LEFT  RADIAL    Drawn by [id number]    Sample type ARTERIAL DRAW    Allens test (pass/fail) PASS PASS    Comment: Performed at [facility], [address], [place], [place] [address]  Urinalysis, Routine w reflex microscopic     Status: Abnormal   Collection Time: 03/27/19  9:25 PM  Result Value Ref Range   Color, Urine AMBER (A) YELLOW    Comment: BIOCHEMICALS MAY BE AFFECTED BY COLOR   APPearance CLOUDY (A) CLEAR   Specific Gravity, Urine 1.026 1.005 - 1.030   pH 5.0 5.0 - 8.0   Glucose, UA NEGATIVE NEGATIVE mg/dL   Hgb urine dipstick MODERATE (A) NEGATIVE   Bilirubin Urine NEGATIVE NEGATIVE   Ketones, ur NEGATIVE NEGATIVE mg/dL   Protein, ur 818 (A) NEGATIVE mg/dL   Nitrite NEGATIVE NEGATIVE   Leukocytes,Ua NEGATIVE NEGATIVE   RBC / HPF >50 (H) 0 - 5 RBC/hpf   WBC, UA 6-10 0 - 5 WBC/hpf   Bacteria, UA MANY (A) NONE SEEN   Squamous Epithelial / LPF 0-5 0 - 5   Mucus PRESENT    Hyaline Casts, UA PRESENT    Amorphous Crystal PRESENT     Comment: Performed at [facility], [address], [place], [place] [address]   Dg Abdomen 1 View  Result Date: Mar 27, 2019 CLINICAL DATA:  Status post NG tube placement. EXAM: ABDOMEN - 1 VIEW COMPARISON:  None. FINDINGS: NG tube is in place with the side port near the gastroesophageal junction. The tube should be advanced 2-3 cm. Visualized bowel loops are dilated. Both walls of loops of small bowel or visible worrisome for free intraperitoneal air. IMPRESSION: Findings worrisome for free intraperitoneal air. Left-side-down decubitus film recommended for further evaluation. NG tube should be advanced 2-3 cm for better positioning. Critical Value/emergent results were called by telephone at the time of interpretation on 03/27/2019 at 8:53 pm to Dr. Melene Plan , who verbally acknowledged these results. Electronically Signed   By: Drusilla Kanner M.D.   On: 03/27/19 20:56   Dg Chest Port 1 View  Result Date:  Mar 27, 2019 CLINICAL DATA:  73 year old female status post intubation. EXAM: PORTABLE CHEST 1 VIEW COMPARISON:  Chest x-ray 03/27/2019. FINDINGS: An endotracheal tube is in place with tip 6.1 cm above the carina. A nasogastric tube is seen extending into the stomach, however, the tip of the nasogastric tube extends below the lower margin of the image. Transcutaneous defibrillator pad projecting over the left hemithorax. Extensive bilateral airspace consolidation with significantly worsened aeration compared to the prior examination, particularly in the right lung. Moderate left pleural effusion. No definite right pleural effusion. Pulmonary vasculature is obscured. Mild-to-moderate cardiomegaly. Aortic atherosclerosis. IMPRESSION: 1. Support apparatus, as above. 2. Significantly worsening aeration in the lungs bilaterally, compatible with worsening multilobar pneumonia. 3. Aortic atherosclerosis. 4. Cardiomegaly. Electronically Signed   By: Trudie Reed M.D.   On: Mar 27, 2019 20:55   Dg Chest [facility] 1 View  Result  Date: 04-21-19 CLINICAL DATA:  Shortness of breath for the past 2 days. EXAM: PORTABLE CHEST 1 VIEW COMPARISON:  None. FINDINGS: The cardiomediastinal silhouette is enlarged. The left heart border is obscured. Normal pulmonary vascularity. Dense consolidation in the lingula and left lower lobe. Central consolidation in the right lower lobe. Moderate left pleural effusion. No pneumothorax. No acute osseous abnormality. IMPRESSION: 1. Findings concerning for multifocal pneumonia involving the left upper lobe and both lower lobes. Followup PA and lateral chest X-ray is recommended in 3-4 weeks following trial of antibiotic therapy to ensure resolution and exclude underlying malignancy. 2. Moderate left pleural effusion. Electronically Signed   By: Obie Dredge M.D.   On: 04-21-19 17:55   Dg Abd Decub  Result Date: April 21, 2019 CLINICAL DATA:  Possible free intraperitoneal air EXAM: ABDOMEN - 1  VIEW DECUBITUS COMPARISON:  Film from earlier in the same day. FINDINGS: Scattered large and small bowel gas is noted. The gastric catheter is stable and again should be advanced. No free intraperitoneal air is noted. IMPRESSION: No evidence of free air is noted. Electronically Signed   By: Alcide Clever M.D.   On: April 21, 2019 21:32    Pending Labs Unresulted Labs (From admission, onward)    Start     Ordered   03/31/19 0500  Creatinine, serum  (enoxaparin (LOVENOX)    CrCl >/= 30 ml/min)  Weekly,   R    Comments:  while on enoxaparin therapy    2019/04/21 2220   04/21/2019 2221  Respiratory Panel by PCR  (Pediatric Respiratory Virus Panel w droplet and contact precautions)  Once,   R     04-21-2019 2220   04-21-19 2221  Culture, respiratory (non-expectorated)  ONCE - STAT,   R     21-Apr-2019 2220   April 21, 2019 2220  Strep pneumoniae urinary antigen  (not at [facility])  Once,   R     Apr 21, 2019 2220   April 21, 2019 2220  Legionella Pneumophila Serogp 1 Ur Ag  Once,   R     21-Apr-2019 2220   2019-04-21 2218  CBC  (enoxaparin (LOVENOX)    CrCl >/= 30 ml/min)  Once,   R    Comments:  Baseline for enoxaparin therapy IF NOT ALREADY DRAWN.  Notify MD if PLT < 100 K.    Apr 21, 2019 2220   04/21/2019 2218  Creatinine, serum  (enoxaparin (LOVENOX)    CrCl >/= 30 ml/min)  Once,   R    Comments:  Baseline for enoxaparin therapy IF NOT ALREADY DRAWN.    04-21-2019 2220   04-21-19 1622  Blood culture (routine x 2)  BLOOD CULTURE X 2,   STAT     2019-04-21 1622          Vitals/Pain Today's Vitals   2019-04-21 2220 2019-04-21 2225 04/21/2019 2230 21-Apr-2019 2235  BP: (!) 87/66 (!) 84/63 (!) 82/63 (!) 83/61  Pulse: (!) 107 (!) 104 (!) 106 (!) 106  Resp: 18 18 18 18   Temp:      TempSrc:      SpO2:      Weight:      PainSc:        Isolation Precautions Droplet precaution  Medications Medications  0.9 %  sodium chloride infusion (500 mLs Intravenous New Bag/Given 04/21/19 1733)  ketamine (KETALAR) 10 MG/ML injection (  Not Given  April 21, 2019 1915)  norepinephrine (LEVOPHED) 4mg  in premix infusion (21 mcg/min Intravenous Rate/Dose Change 04-21-19 2243)  enoxaparin (LOVENOX) injection 40 mg (has no  administration in time range)  ipratropium-albuterol (DUONEB) 0.5-2.5 (3) MG/3ML nebulizer solution 3 mL (has no administration in time range)  fentaNYL (SUBLIMAZE) injection 25 mcg (has no administration in time range)  fentaNYL 2500mcg in NS 250mL (5910mcg/ml) infusion-PREMIX (has no administration in time range)  fentaNYL (SUBLIMAZE) bolus via infusion 25 mcg (has no administration in time range)  midazolam (VERSED) injection 1 mg (has no administration in time range)  midazolam (VERSED) injection 1 mg (has no administration in time range)  0.9 %  sodium chloride infusion (has no administration in time range)  acetaminophen (TYLENOL) tablet 1,000 mg (1,000 mg Oral Given 2019/08/17 1646)  ceFEPIme (MAXIPIME) 2 g in sodium chloride 0.9 % 100 mL IVPB (0 g Intravenous Stopped 2019/08/17 1754)  metroNIDAZOLE (FLAGYL) IVPB 500 mg (0 mg Intravenous Stopped 2019/08/17 1834)  vancomycin (VANCOCIN) IVPB 1000 mg/200 mL premix (0 mg Intravenous Stopped 2019/08/17 1911)  sodium chloride 0.9 % bolus 1,000 mL (0 mLs Intravenous Stopped 2019/08/17 1911)    And  sodium chloride 0.9 % bolus 1,000 mL (0 mLs Intravenous Stopped 2019/08/17 2057)  etomidate (AMIDATE) injection (10 mg Intravenous Given 2019/08/17 1918)  rocuronium (ZEMURON) injection (100 mg Intravenous Given 2019/08/17 1919)  EPINEPHrine (ADRENALIN) 1 MG/10ML injection (0.5 mg Intravenous Given 2019/08/17 1925)  EPINEPHrine (ADRENALIN) 1 MG/10ML injection (0.5 mg Intravenous Given 2019/08/17 1927)  atropine injection (1 mg Intravenous Given 2019/08/17 1927)    Mobility non-ambulatory     Focused Assessments Cardiac Assessment Handoff:  Cardiac Rhythm: Sinus tachycardia Lab Results  Component Value Date   TROPONINI 0.25 (HH) 02020/07/1919   No results found for: DDIMER Does the Patient currently have  chest pain? No     R Recommendations: See Admitting Provider Note  Report given to: Rolly SalterHaley RN  Additional Notes: Patient was coded

## 2019-03-24 NOTE — ED Notes (Signed)
Date and time results received: 03/22/2019 5:11 PM  (use smartphrase ".now" to insert current time)  Test: Lactic Critical Value: 10.6  Name of Provider Notified: Abby PA  Orders Received? Or Actions Taken?: see orders

## 2019-03-24 NOTE — Progress Notes (Signed)
Pt could not tolerate BIPAP at this time. RN notified.

## 2019-03-24 NOTE — ED Notes (Signed)
Patient cleaned and repositioned in bed.

## 2019-03-24 NOTE — ED Notes (Signed)
Pulse oximetry reading not picking up well. Will attempt to place in another location. Respiratory called.

## 2019-03-24 NOTE — Progress Notes (Signed)
RT Note: 7.5 E.T. Tube advanced X3 cm to 23 cm from 20 cm per CXR.

## 2019-03-24 NOTE — ED Notes (Signed)
Date and time results received: 03/29/2019 2042 (use smartphrase ".now" to insert current time)  Test: Lactic Acid Critical Value: > 11.2  Name of Provider Notified: Adela Lank Md  Orders Received? Or Actions Taken?: waiting on orders

## 2019-03-24 NOTE — ED Notes (Signed)
Date and time results received: 03/15/2019 5:29 PM  (use smartphrase ".now" to insert current time)  Test: Troponin Critical Value: 0.25  Name of Provider Notified: Abby PA  Orders Received? Or Actions Taken?: Orders Received - See Orders for details

## 2019-03-24 NOTE — ED Notes (Signed)
Jonna Munro daughter [phone]

## 2019-03-24 NOTE — Sedation Documentation (Addendum)
Central line insertion begins. Pre-procedure checklist and time out completed before procedure started.

## 2019-03-24 NOTE — ED Notes (Signed)
X-rays completed-patient unresponsive-titrating Nor-epi

## 2019-03-25 ENCOUNTER — Encounter (HOSPITAL_COMMUNITY): Payer: Self-pay

## 2019-03-25 ENCOUNTER — Inpatient Hospital Stay (HOSPITAL_COMMUNITY): Payer: Medicare Other

## 2019-03-25 LAB — GLUCOSE, CAPILLARY
Glucose-Capillary: 21 mg/dL — CL (ref 70–99)
Glucose-Capillary: 127 mg/dL — ABNORMAL HIGH (ref 70–99)
Glucose-Capillary: 66 mg/dL — ABNORMAL LOW (ref 70–99)
Glucose-Capillary: 115 mg/dL — ABNORMAL HIGH (ref 70–99)

## 2019-03-25 LAB — RESPIRATORY PANEL BY PCR

## 2019-03-25 LAB — BLOOD GAS, ARTERIAL
Acid-base deficit: 10 mmol/L — ABNORMAL HIGH (ref 0.0–2.0)
Acid-base deficit: 22 mmol/L — ABNORMAL HIGH (ref 0.0–2.0)
Acid-base deficit: 3.1 mmol/L — ABNORMAL HIGH (ref 0.0–2.0)
Acid-base deficit: 7.6 mmol/L — ABNORMAL HIGH (ref 0.0–2.0)
Bicarbonate: 10 mmol/L — ABNORMAL LOW (ref 20.0–28.0)
Bicarbonate: 17.4 mmol/L — ABNORMAL LOW (ref 20.0–28.0)
Bicarbonate: 20.3 mmol/L (ref 20.0–28.0)
Bicarbonate: 23.4 mmol/L (ref 20.0–28.0)
Drawn by: 232811
Drawn by: 232811
Drawn by: 232811
Drawn by: 257881
FIO2: 100
FIO2: 100
FIO2: 75
FIO2: 80
MECHVT: 300 mL
MECHVT: 300 mL
MECHVT: 300 mL
MECHVT: 300 mL
O2 Saturation: 80.5 %
O2 Saturation: 86.3 %
O2 Saturation: 86.8 %
O2 Saturation: 92.9 %
PEEP: 10 cmH2O
PEEP: 10 cmH2O
PEEP: 10 cmH2O
PEEP: 10 cmH2O
Patient temperature: 97.5
Patient temperature: 98.2
Patient temperature: 98.2
Patient temperature: 98.6
RATE: 24 resp/min
RATE: 35 resp/min
RATE: 35 resp/min
RATE: 35 resp/min
pCO2 arterial: 46.6 mmHg (ref 32.0–48.0)
pCO2 arterial: 49 mmHg — ABNORMAL HIGH (ref 32.0–48.0)
pCO2 arterial: 51.7 mmHg — ABNORMAL HIGH (ref 32.0–48.0)
pCO2 arterial: 53.4 mmHg — ABNORMAL HIGH (ref 32.0–48.0)
pH, Arterial: 6.939 — CL (ref 7.350–7.450)
pH, Arterial: 7.195 — CL (ref 7.350–7.450)
pH, Arterial: 7.2 — ABNORMAL LOW (ref 7.350–7.450)
pH, Arterial: 7.279 — ABNORMAL LOW (ref 7.350–7.450)
pO2, Arterial: 60.5 mmHg — ABNORMAL LOW (ref 83.0–108.0)
pO2, Arterial: 64.5 mmHg — ABNORMAL LOW (ref 83.0–108.0)
pO2, Arterial: 76.9 mmHg — ABNORMAL LOW (ref 83.0–108.0)
pO2, Arterial: 86.3 mmHg (ref 83.0–108.0)

## 2019-03-25 LAB — ECHOCARDIOGRAM COMPLETE
Height: 62 in
Weight: 2338.64 oz

## 2019-03-25 LAB — BLOOD CULTURE ID PANEL (REFLEXED)
Acinetobacter baumannii: NOT DETECTED
Candida albicans: NOT DETECTED
Candida glabrata: NOT DETECTED
Candida krusei: NOT DETECTED
Candida parapsilosis: NOT DETECTED
Candida tropicalis: NOT DETECTED
Enterobacter cloacae complex: NOT DETECTED
Enterobacteriaceae species: NOT DETECTED
Enterococcus species: NOT DETECTED
Escherichia coli: NOT DETECTED
Haemophilus influenzae: DETECTED — AB
Klebsiella oxytoca: NOT DETECTED
Klebsiella pneumoniae: NOT DETECTED
Listeria monocytogenes: NOT DETECTED
Neisseria meningitidis: NOT DETECTED
Proteus species: NOT DETECTED
Pseudomonas aeruginosa: NOT DETECTED
Serratia marcescens: NOT DETECTED
Staphylococcus aureus (BCID): NOT DETECTED
Staphylococcus species: NOT DETECTED
Streptococcus agalactiae: NOT DETECTED
Streptococcus pneumoniae: NOT DETECTED
Streptococcus pyogenes: NOT DETECTED
Streptococcus species: NOT DETECTED

## 2019-03-25 LAB — CORTISOL: Cortisol, Plasma: 42.1 ug/dL

## 2019-03-25 LAB — STREP PNEUMONIAE URINARY ANTIGEN: Strep Pneumo Urinary Antigen: NEGATIVE

## 2019-03-25 LAB — MAGNESIUM
Magnesium: 1.8 mg/dL (ref 1.7–2.4)
Magnesium: 2.1 mg/dL (ref 1.7–2.4)

## 2019-03-25 LAB — MRSA PCR SCREENING: MRSA by PCR: NEGATIVE

## 2019-03-25 LAB — PHOSPHORUS: Phosphorus: 5.9 mg/dL — ABNORMAL HIGH (ref 2.5–4.6)

## 2019-03-25 MED ORDER — NOREPINEPHRINE BITARTRATE 1 MG/ML IV SOLN
0.0000 ug/min | INTRAVENOUS | Status: DC
  Administered 2019-03-25: 40 ug/min via INTRAVENOUS
  Administered 2019-03-25 (×2): 70 ug/min via INTRAVENOUS
  Filled 2019-03-25 (×6): qty 16

## 2019-03-25 MED ORDER — CHLORHEXIDINE GLUCONATE 0.12% ORAL RINSE (MEDLINE KIT)
15.0000 mL | Freq: Two times a day (BID) | OROMUCOSAL | Status: DC
  Administered 2019-03-25: 15 mL via OROMUCOSAL

## 2019-03-25 MED ORDER — EPINEPHRINE 1 MG/10ML IJ SOSY
PREFILLED_SYRINGE | INTRAMUSCULAR | Status: AC | PRN
  Administered 2019-03-24: 1 mg via INTRAVENOUS

## 2019-03-25 MED ORDER — DEXTROSE 50 % IV SOLN
INTRAVENOUS | Status: AC
  Administered 2019-03-25: 25 g via INTRAVENOUS
  Filled 2019-03-25: qty 50

## 2019-03-25 MED ORDER — SODIUM CHLORIDE 0.9% FLUSH: 10.0000 mL | INTRAVENOUS | Status: DC | PRN

## 2019-03-25 MED ORDER — INSULIN ASPART 100 UNIT/ML ~~LOC~~ SOLN: 1.0000 [IU] | SUBCUTANEOUS | Status: DC

## 2019-03-25 MED ORDER — DEXTROSE 50 % IV SOLN
25.0000 g | INTRAVENOUS | Status: AC
  Administered 2019-03-25: 25 g via INTRAVENOUS

## 2019-03-25 MED ORDER — SODIUM BICARBONATE 8.4 % IV SOLN
INTRAVENOUS | Status: AC | PRN
  Administered 2019-03-24: 100 meq via INTRAVENOUS

## 2019-03-25 MED ORDER — VITAL AF 1.2 CAL PO LIQD
1000.0000 mL | ORAL | Status: DC
  Administered 2019-03-25: 1000 mL

## 2019-03-25 MED ORDER — ORAL CARE MOUTH RINSE
15.0000 mL | OROMUCOSAL | Status: DC
  Administered 2019-03-25 (×4): 15 mL via OROMUCOSAL

## 2019-03-25 MED ORDER — DEXTROSE 50 % IV SOLN: 25.0000 g | INTRAVENOUS | Status: DC

## 2019-03-25 MED ORDER — VANCOMYCIN HCL IN DEXTROSE 750-5 MG/150ML-% IV SOLN
750.0000 mg | INTRAVENOUS | Status: DC
  Filled 2019-03-25: qty 150

## 2019-03-25 MED ORDER — DEXTROSE 50 % IV SOLN
12.5000 g | INTRAVENOUS | Status: AC
  Administered 2019-03-25: 12.5 g via INTRAVENOUS
  Filled 2019-03-25: qty 50

## 2019-03-25 MED ORDER — VITAL HIGH PROTEIN PO LIQD: 1000.0000 mL | ORAL | Status: DC

## 2019-03-25 MED ORDER — MORPHINE SULFATE (PF) 2 MG/ML IV SOLN
INTRAVENOUS | Status: AC
  Administered 2019-03-25: 2 mg via INTRAVENOUS
  Filled 2019-03-25: qty 1

## 2019-03-25 MED ORDER — HYDROCORTISONE NA SUCCINATE PF 100 MG IJ SOLR
50.0000 mg | Freq: Four times a day (QID) | INTRAMUSCULAR | Status: DC
  Administered 2019-03-25 (×2): 50 mg via INTRAVENOUS
  Filled 2019-03-25 (×2): qty 2

## 2019-03-25 MED ORDER — SODIUM BICARBONATE 8.4 % IV SOLN
100.0000 meq | Freq: Once | INTRAVENOUS | Status: AC
  Administered 2019-03-25: 100 meq via INTRAVENOUS
  Filled 2019-03-25: qty 100

## 2019-03-25 MED ORDER — IPRATROPIUM-ALBUTEROL 0.5-2.5 (3) MG/3ML IN SOLN: 3.0000 mL | RESPIRATORY_TRACT | Status: DC | PRN

## 2019-03-25 MED ORDER — PANTOPRAZOLE SODIUM 40 MG PO PACK
40.0000 mg | PACK | ORAL | Status: DC
  Administered 2019-03-25: 40 mg
  Filled 2019-03-25: qty 20

## 2019-03-25 MED ORDER — CHLORHEXIDINE GLUCONATE CLOTH 2 % EX PADS: 6.0000 | MEDICATED_PAD | Freq: Every day | CUTANEOUS | Status: DC

## 2019-03-25 MED ORDER — MORPHINE SULFATE (PF) 2 MG/ML IV SOLN
2.0000 mg | INTRAVENOUS | Status: DC | PRN
  Filled 2019-03-25: qty 2

## 2019-03-25 MED ORDER — PRO-STAT SUGAR FREE PO LIQD: 30.0000 mL | Freq: Two times a day (BID) | ORAL | Status: DC

## 2019-03-25 MED ORDER — DEXTROSE 10 % IV SOLN
INTRAVENOUS | Status: DC
  Administered 2019-03-25: 09:00:00 via INTRAVENOUS

## 2019-03-25 MED ORDER — MAGNESIUM SULFATE 2 GM/50ML IV SOLN
2.0000 g | Freq: Once | INTRAVENOUS | Status: AC
  Administered 2019-03-25: 2 g via INTRAVENOUS
  Filled 2019-03-25: qty 50

## 2019-03-25 MED ORDER — SODIUM BICARBONATE 8.4 % IV SOLN
INTRAVENOUS | Status: AC
  Filled 2019-03-25: qty 50

## 2019-03-25 MED ORDER — SODIUM CHLORIDE 0.9% FLUSH: 10.0000 mL | Freq: Two times a day (BID) | INTRAVENOUS | Status: DC

## 2019-03-25 MED ORDER — SODIUM CHLORIDE 0.9 % IV SOLN
2.0000 g | Freq: Two times a day (BID) | INTRAVENOUS | Status: DC
  Administered 2019-03-25: 2 g via INTRAVENOUS
  Filled 2019-03-25 (×2): qty 2

## 2019-03-25 MED ORDER — MORPHINE SULFATE (PF) 2 MG/ML IV SOLN
2.0000 mg | INTRAVENOUS | Status: AC
  Administered 2019-03-25: 2 mg via INTRAVENOUS

## 2019-03-25 MED ORDER — ACETAMINOPHEN 325 MG PO TABS: 650.0000 mg | ORAL_TABLET | Freq: Four times a day (QID) | ORAL | Status: DC | PRN

## 2019-03-25 MED ORDER — METRONIDAZOLE IN NACL 5-0.79 MG/ML-% IV SOLN
500.0000 mg | Freq: Three times a day (TID) | INTRAVENOUS | Status: DC
  Administered 2019-03-25: 500 mg via INTRAVENOUS
  Filled 2019-03-25: qty 100

## 2019-03-25 MED FILL — Medication: Qty: 1 | Status: AC

## 2019-03-26 LAB — LEGIONELLA PNEUMOPHILA SEROGP 1 UR AG: L. pneumophila Serogp 1 Ur Ag: NEGATIVE

## 2019-03-26 LAB — GLUCOSE, CAPILLARY: Glucose-Capillary: 39 mg/dL — CL (ref 70–99)

## 2019-03-27 ENCOUNTER — Telehealth: Payer: Self-pay

## 2019-03-27 LAB — CULTURE, BLOOD (ROUTINE X 2)

## 2019-03-27 NOTE — Telephone Encounter (Signed)
Received dc from [employer]. DC is for cremation and Patient is a patient of Doctor Sood.   DC will be taken to [facility] ICU for signature.

## 2019-03-28 LAB — CULTURE, BLOOD (ROUTINE X 2): Special Requests: ADEQUATE

## 2019-03-28 LAB — CULTURE, RESPIRATORY W GRAM STAIN

## 2019-03-30 NOTE — Progress Notes (Signed)
  Echocardiogram 2D Echocardiogram has been performed.  Claire Flores Apr 21, 2019, 10:32 AM

## 2019-03-30 NOTE — Progress Notes (Signed)
eLink Physician-Brief Progress Note Patient Name: Claire Flores DOB: 25-Nov-1945 MRN: [id number]   Date of Service  03/26/2019  HPI/Events of Note  Patient arrives in ICU with BP = 73/41 with MAP = 49 and ABG on 100%/PRVC 24/TV 300/P 10 = 6.951/49/76/10  eICU Interventions  Will order: 1. Bolus with 0.9 NaCl 1 liter IV over 1 hour now. 2. Monitor CVP now and Q 4 hours.  3. NaHCO3 200 meq IV now.  4. Increase PRVC rate to 35. 5. Vasopressin IV infusion at shock dose.  6. NaHCO3 IV infusion to run IV at 100 mL/hour. 7. ABG at 1 AM.      Intervention Category Major Interventions: Hypotension - evaluation and management  Bralon Antkowiak Eugene 03/24/2019, 12:00 AM

## 2019-03-30 NOTE — Death Summary Note (Signed)
Claire Flores was a 73 year old female admitted from [facility] assisted living center on 03/24/2019 with shortness of breath.  Found to have hypoxia and pulmonary infiltrate with ARDS.  Required intubation, and developed cardiac arrest in ER with 22 min for ROSC.  COVID testing negative.  Started on antibiotics, ventilator, and pressors.  Patients family arrived and agreed she would not want these kind of aggressive therapies given poor prognosis for meaningful recover.  Decision for DNR status and transition to comfort measures.  She was extubated on 04/21/2019 and expired at 1751.  Final diagnoses: Acute hypoxic, hypercapnic respiratory failure Haemophilus influenzae pneumonia with bacteremia ARDS PEA cardiac arrest Septic shock Cardiogenic shock Acute systolic CHF with EF 10 to 15% Metabolic acidosis with lactic acidosis Severe aortic stenosis AKI from ATN Anemia and thrombocytopenia from critical illness Acute anoxic encephalopathy History of emphysema History of rheumatoid arthritis COVID ruled out  Coralyn Helling, MD [facility] Pulmonary/Critical Care 03/29/2019, 4:18 PM

## 2019-03-30 NOTE — Progress Notes (Signed)
Updated pt's daughter over the phone.  Explained that pt is too unstable to go for neuroimaging at this time.    Coralyn Helling, MD [facility] Pulmonary/Critical Care 03/08/2019, 8:23 AM

## 2019-03-30 NOTE — Progress Notes (Addendum)
Initial Nutrition Assessment  RD working remotely.   DOCUMENTATION CODES:   (unable to assess for malnutrition at this time.)  INTERVENTION:  - will order TF: Vital AF 1.2 @ 35 ml/hr to advance by 10 ml every 4 hours to reach goal rate of 55 ml/hr. - at goal rate, this regimen will provide 1584 kcal, 99 grams protein, and 1070 ml free water.   NUTRITION DIAGNOSIS:   Inadequate oral intake related to inability to eat as evidenced by NPO status.  GOAL:   Patient will meet greater than or equal to 90% of their needs  MONITOR:   Vent status, Labs, Weight trends, Skin  REASON FOR ASSESSMENT:   Ventilator  ASSESSMENT:   73 year old female with past medical history significant for emphysema, RA, and HTN. She resides at [facility] ALF. She presented to [facility] ED with acute SOB and increased respiratory rate. Patient was unable to tolerate BiPAP or NRB and the decision was made to intubate. During intubation, patient became unresponsive and went into cardiac arrest for a total of 22 minutes; Code Blue initiated.    Patient remains intubated with NGT in L nare, clamped. Per chart review, current weight is 146 lb and no other weight recordings available. Noted that patient is from ALF. No information from facility available in electronic chart.    Patient is currently intubated on ventilator support MV: 10.4 L/min Temp (24hrs), Avg:97.9 F (36.6 C), Min:97.2 F (36.2 C), Max:101.1 F (38.4 C) Propofol: none   Medications reviewed; 40 mg IV lasix x1 dose 5/27, 2 g IV Mg sulfate x1 run 5/27, 100 mEq sodium bicarb x1 dose and 200 mEq sodium bicarb x1 dose. 5/27.  Labs reviewed; creatinine: 1.14 mg/dl, GFR: 47 ml/min. IVF; 150 mEq sodium bicarb in sterile water @ 125 ml/hr. Drips; vaso @ 0.03 units/min, Levo @ 70 mcg/min.      NUTRITION - FOCUSED PHYSICAL EXAM:  unable to perform at this time.   Diet Order:   Diet Order            Diet NPO time specified  Diet effective now               EDUCATION NEEDS:   Not appropriate for education at this time  Skin:  Skin Assessment: Skin Integrity Issues: Skin Integrity Issues:: Stage II Stage II: stage 2 sacrum and back  Last BM:  PTA/unknown  Height:   Ht Readings from Last 1 Encounters:  15-Apr-2019 5\' 2"  (1.575 m)    Weight:   Wt Readings from Last 1 Encounters:  2019/04/15 66.3 kg    Ideal Body Weight:  50 kg  BMI:  Body mass index is 26.73 kg/m.  Estimated Nutritional Needs:   Kcal:  1595 kcal  Protein:  93-106 grams  Fluid:  >/= 1.7 L/day      Trenton Gammon, MS, RD, LDN, [facility] Inpatient Clinical Dietitian Pager # [phone] After hours/weekend pager # [phone]

## 2019-03-30 NOTE — Procedures (Signed)
Arterial Catheter Insertion Procedure Note Claire Flores [id number] [birth year]  Procedure: Insertion of Arterial Catheter  Indications: Blood pressure monitoring  Procedure Details Consent: Unable to obtain consent because of emergent medical necessity. Time Out: Verified patient identification, verified procedure, site/side was marked, verified correct patient position, special equipment/implants available, medications/allergies/relevent history reviewed, required imaging and test results available.  Performed  Maximum sterile technique was used including antiseptics, cap, gloves, gown, hand hygiene and mask. Skin prep: Chlorhexidine; local anesthetic administered 20 gauge catheter was inserted into right radial artery using the Seldinger technique. ULTRASOUND GUIDANCE USED: NO Evaluation Blood flow good; BP tracing good. Complications: No apparent complications.   Claire Flores P 04/19/19

## 2019-03-30 NOTE — Progress Notes (Signed)
eLink Physician-Brief Progress Note Patient Name: Claire Flores DOB: [birth year] MRN: [id number]   Date of Service  04-16-2019  HPI/Events of Note  ABG on 100%/PRVC 35/TV 300/P 10 = 7.195/46.6/86.3  eICU Interventions  Will order: 1. NaHCO3 100 meq IV now.  2. Increase NaHCO3 IV infusion to 125 mL/hour.  3. Repeat ABG at 5 AM.      Intervention Category Major Interventions: Respiratory failure - evaluation and management;Acid-Base disturbance - evaluation and management  Jaelyn Cloninger Eugene 04-16-19, 2:39 AM

## 2019-03-30 NOTE — Progress Notes (Signed)
Chaplain providing support around goals of care, grief / loss.   Provided support with Claire Flores daughters, son in law, grandson in context of decisions about care, terminal extubation, death.  Present with family at bedside, on ICU, and in consult room.  Present for goals of care discussion with NP and decision to move to comfort.  Engaged in life review, naming values and awareness of pt's desires.  Provided space for family reflection, normalizing and attending to grief.  Shared prayers with family at bedside.   Present with family during pt death.    Clinical care time: 2.5 hours    Burnis Kingfisher, MDiv, [facility]

## 2019-03-30 NOTE — Progress Notes (Addendum)
Elink, Dr. Arsenio Loader made aware that patient was maxed out on both levophed and vasopressin, with a MAP under 65. Ordered changed levophed ceiling to 62mcg/min.  ABG results called, pH 7.19, CO2 46, PaO2 86, Bicarb 17.   Will continue to monitor.

## 2019-03-30 NOTE — Progress Notes (Addendum)
Family meeting  Problems discussed: Respiratory failure Cardiac arrest Septic shock Probable anoxic injury.   They are all in agreement that Claire Flores would not want to continue w/ aggressive interventions especially in the context of anoxic injury. They also understand that she is unlikely to survive event w/ on-going interventions as currently outlined. They all agree would like to proceed with extubation and transition to comfort. They have asked about autopsy I have let them know that this will be up to Medical examiner and if they request it could be a cost to the family    Simonne Martinet ACNP-BC [facility] Pulmonary/Critical Care Pager # [phone] OR # [phone] if no answer

## 2019-03-30 NOTE — Code Documentation (Addendum)
NG tube placed and placement verified with auscultation. NG tube placed on constant suction per MD. Will place order for xray verification.   Pulse check performed; no pulse palpated. 7.5 ET tube placed by MD; positive CO2.

## 2019-03-30 NOTE — Code Documentation (Addendum)
Pulse check performed; no pulses palpated. Samuel Bouche applied and started for compression. Compressions continued while applying the Peck.

## 2019-03-30 NOTE — Progress Notes (Signed)
Pt was terminally weaned per doctors order.RT placed Pt on 4L Espy

## 2019-03-30 NOTE — Code Documentation (Signed)
Pulse check performed; femoral pulses palpable.

## 2019-03-30 NOTE — Procedures (Signed)
Extubation Procedure Note  Patient Details:   Name: Claire Flores DOB: Mar 11, 1946 MRN: [id number]   Airway Documentation:    Vent end date: (not recorded) Vent end time: (not recorded)   Evaluation  O2 sats: stable throughout Complications: No apparent complications Patient did tolerate procedure well. Bilateral Breath Sounds: Diminished   No  [name] 03/03/2019, 4:48 PM

## 2019-03-30 NOTE — Progress Notes (Signed)
CRITICAL VALUE ALERT  Critical Value:  Glucose 21  Date & Time Notied:  0830  Provider Notified: Dr. Craige Cotta  Orders Received/Actions taken: 1 amp of dextrose, begin D10 infusion

## 2019-03-30 NOTE — Progress Notes (Signed)
Pharmacy Antibiotic Note and Magnesium Replacement  Marilynne Perezhernandez is a 73 y.o. female admitted on 03/19/2019 with sepsis.  Pharmacy has been consulted for cefepime and vancomycin dosing.  Plan: Cefepime 2 Gm IV q12h Flagyl 500 mg IV q8h Vancomycin 1 Gm x1 then 750 mg IV q24h for est AUC = 485 Goal AUC = 400-550 F/u scr/cultures/levels Magnesium 2 Gm x1 per replacement protocol and rx will sign off    Height: 5\' 2"  (157.5 cm) Weight: 146 lb 2.6 oz (66.3 kg) IBW/kg (Calculated) : 50.1  Temp (24hrs), Avg:97.9 F (36.6 C), Min:97.2 F (36.2 C), Max:101.1 F (38.4 C)  Recent Labs  Lab 03/23/2019 1627 03/16/2019 2010 2019-04-24 0030  WBC 8.3  --  6.3  CREATININE 1.01*  --  1.14*  LATICACIDVEN 10.6* >11.0*  --     Estimated Creatinine Clearance: 38.1 mL/min (A) (by C-G formula based on SCr of 1.14 mg/dL (H)).    No Known Allergies  Antimicrobials this admission: 5/26 cefepime >>  5/26 flagyl >>  5/26 vancomycin >>  Dose adjustments this admission:   Microbiology results:  BCx:   UCx:    Sputum:    MRSA PCR:   Thank you for allowing pharmacy to be a part of this patient's care.  Lorenza Evangelist 04/24/2019 2:42 AM

## 2019-03-30 NOTE — Progress Notes (Signed)
Elink made aware that patient again maxed out on new levophed ceiling of 70 mcg/min with vasopressin at shock dose. Holding a MAP of 62-63.   ABG results called, pH 7.20, CO2 53.4, PaO2 64.5, Bicarb 20.3.   Will continue to monitor.

## 2019-03-30 NOTE — Progress Notes (Signed)
Notified CDS at 1751 of death.

## 2019-03-30 NOTE — Progress Notes (Signed)
eLink Physician-Brief Progress Note Patient Name: Claire Flores DOB: 1945/12/22 MRN: [id number]   Date of Service  03/12/2019  HPI/Events of Note  ABG on 80%/PRVC 35/TV 300/P 10 = 7.20/53.4/64.5  eICU Interventions  Will order: 1. NaHCO3 100 meq IV now.  2. Repeat ABG at 8 AM.     Intervention Category Major Interventions: Acid-Base disturbance - evaluation and management;Respiratory failure - evaluation and management  Lenell Antu 03/24/2019, 5:39 AM

## 2019-03-30 NOTE — Progress Notes (Addendum)
Levophed, vasopressin, and bicarb drips turned off at 1539. Patient expired at 1550 with daughters at bedside. Oneita Hurt, RN was 2nd RN to listen to confirm. Dr. Craige Cotta paged at 1700.

## 2019-03-30 NOTE — Progress Notes (Signed)
Tracheal Aspirate Sputum sample obtained/labelled/sent to lab.

## 2019-03-30 NOTE — Progress Notes (Signed)
NAME:  Claire Flores, MRN:  [id number], DOB:  [birth year], LOS: 1 ADMISSION DATE:  03/28/2019, CONSULTATION DATE:  03/02/2019 REFERRING MD:  Dr. Adela Lank, ER, CHIEF COMPLAINT:  Short of breath   Brief History   73 yo female from [facility] assisted living center with shortness of breath.  Found to have hypoxia and pulmonary infiltrate with ARDS.  Required intubation, and developed cardiac arrest in ER with 22 min for ROSC.  Past Medical History  RA, Emphysema, OA, Allergies, HTN treated with weight loss  Significant Hospital Events   5/26 Admit, ARDS, cardiac arrest in ER  Consults:    Procedures:  ETT 5/26 >>  Rt radial aline 5/26 >> Rt IJ CVL 5/27 >>   Significant Diagnostic Tests:    Micro Data:  COVID 5/26 >> negative Blood 5/26 >>  RVP 5/27 >> negative Sputum 5/27 >>  Pneumococcal Ag 5/27 >> negative Legionella Ag 5/27 >>   Antimicrobials:  Cefepime 5/26 >> Vancomycin 5/26 >>  Interim history/subjective:  Remains on increased PEEP/FiO2, multiple pressors.  Hasn't received any sedation.  Objective   Blood pressure (!) 89/47, pulse (!) 116, temperature (!) 97.5 F (36.4 C), temperature source Axillary, resp. rate (!) 35, height 5\' 2"  (1.575 m), weight 66.3 kg, SpO2 90 %. CVP:  [8 mmHg-16 mmHg] 9 mmHg  Vent Mode: PRVC FiO2 (%):  [75 %-100 %] 75 % Set Rate:  [18 bmp-35 bmp] 35 bmp Vt Set:  [30 mL-400 mL] 30 mL PEEP:  [5 cmH20-10 cmH20] 10 cmH20 Plateau Pressure:  [21 cmH20-26 cmH20] 22 cmH20   Intake/Output Summary (Last 24 hours) at 04/20/19 0755 Last data filed at April 20, 2019 0600 Gross per 24 hour  Intake 8490.15 ml  Output 100 ml  Net 8390.15 ml   Filed Weights   03/18/2019 1816 2019/04/20 0000 April 20, 2019 0416  Weight: 61.5 kg 66.3 kg 66.3 kg    Examination:  General - ill appearing, increased WOB Eyes - pupils mid point ENT - ETT in place Cardiac - regular, tachycardic, 2/6 SM Chest - b/l crackles Abdomen - soft, non tender, decreased bowel sounds  Extremities - cool, 1+ edema Skin - no rashes Neuro - unresponsive  CXR (reviewed by me) - diffuse b/l ASD   Resolved Hospital Problem list     Assessment & Plan:   Acute hypoxic, hypercapnic respiratory failure with ARDS 2nd to HCAP. Hx of emphysema. Plan - goal SpO2 88 to 95% - f/u CXR, ABG - day 2 of ABx - prn BDs - allow for permissive hypercapnia  Septic shock. Plan - pressors to keep MAP > 65 - add solu cortef while awaiting cortisol level  Respiratory leading to PEA cardiac arrest with cardiogenic shock. Plan - f/u Echo  Metabolic acidosis with lactic acidosis. AKI from ATN. Plan - continue IV fluids with HCO3 - f/u BMET - monitor urine outpt  Anemia, thrombocytopenia of critical illness. Plan - f/u CBC - transfuse for Hb < 7  Acute anoxic encephalopathy. Plan - monitor neuro status - not stable at this time to transport to radiology for neuroimaging studies - prognosis seems poor  Best practice:  Diet: tube feeds DVT prophylaxis: lovenox GI prophylaxis: protonix Mobility: bed rest Code Status: no CPR, no defibrillation Disposition: ICU  Labs    CMP Latest Ref Rng & Units 20-Apr-2019 03/16/2019  Glucose 70 - 99 mg/dL - 035(W)  BUN 8 - 23 mg/dL - 65(K)  Creatinine 8.12 - 1.00 mg/dL 7.51(Z) 0.01(V)  Sodium 135 - 145  mmol/L - 139  Potassium 3.5 - 5.1 mmol/L - 4.2  Chloride 98 - 111 mmol/L - 107  CO2 22 - 32 mmol/L - 12(L)  Calcium 8.9 - 10.3 mg/dL - 9.1  Total Protein 6.5 - 8.1 g/dL - 7.3  Total Bilirubin 0.3 - 1.2 mg/dL - 1.6(X1.4(H)  Alkaline Phos 38 - 126 U/L - 139(H)  AST 15 - 41 U/L - 38  ALT 0 - 44 U/L - 18   CBC Latest Ref Rng & Units 08/19/2019 03/11/2019  WBC 4.0 - 10.5 K/uL 6.3 8.3  Hemoglobin 12.0 - 15.0 g/dL 0.9(U9.5(L) 04.512.4  Hematocrit 36.0 - 46.0 % 32.2(L) 41.5  Platelets 150 - 400 K/uL 127(L) 248   ABG    Component Value Date/Time   PHART 7.200 (L) 010/21/2020 0510   PCO2ART 53.4 (H) 010/21/2020 0510   PO2ART 64.5 (L) 010/21/2020  0510   HCO3 20.3 010/21/2020 0510   ACIDBASEDEF 7.6 (H) 010/21/2020 0510   O2SAT 86.8 010/21/2020 0510   CBG (last 3)  No results for input(s): GLUCAP in the last 72 hours.   CC time 38 minutes  Coralyn HellingVineet Vere Diantonio, MD [facility] Pulmonary/Critical Care 08/19/2019, 8:09 AM

## 2019-03-30 DEATH — deceased

## 2019-04-01 NOTE — Telephone Encounter (Signed)
Received original signed D/C, funeral home notified for pick up.  

## 2020-12-04 IMAGING — DX ABDOMEN - 1 VIEW DECUBITUS
1 series · 1 of 1 positions shown · non-contrast
Comparison: Film from earlier in the same day.

CLINICAL DATA: Possible free intraperitoneal air

EXAM:
ABDOMEN - 1 VIEW DECUBITUS

[abdomen decu]
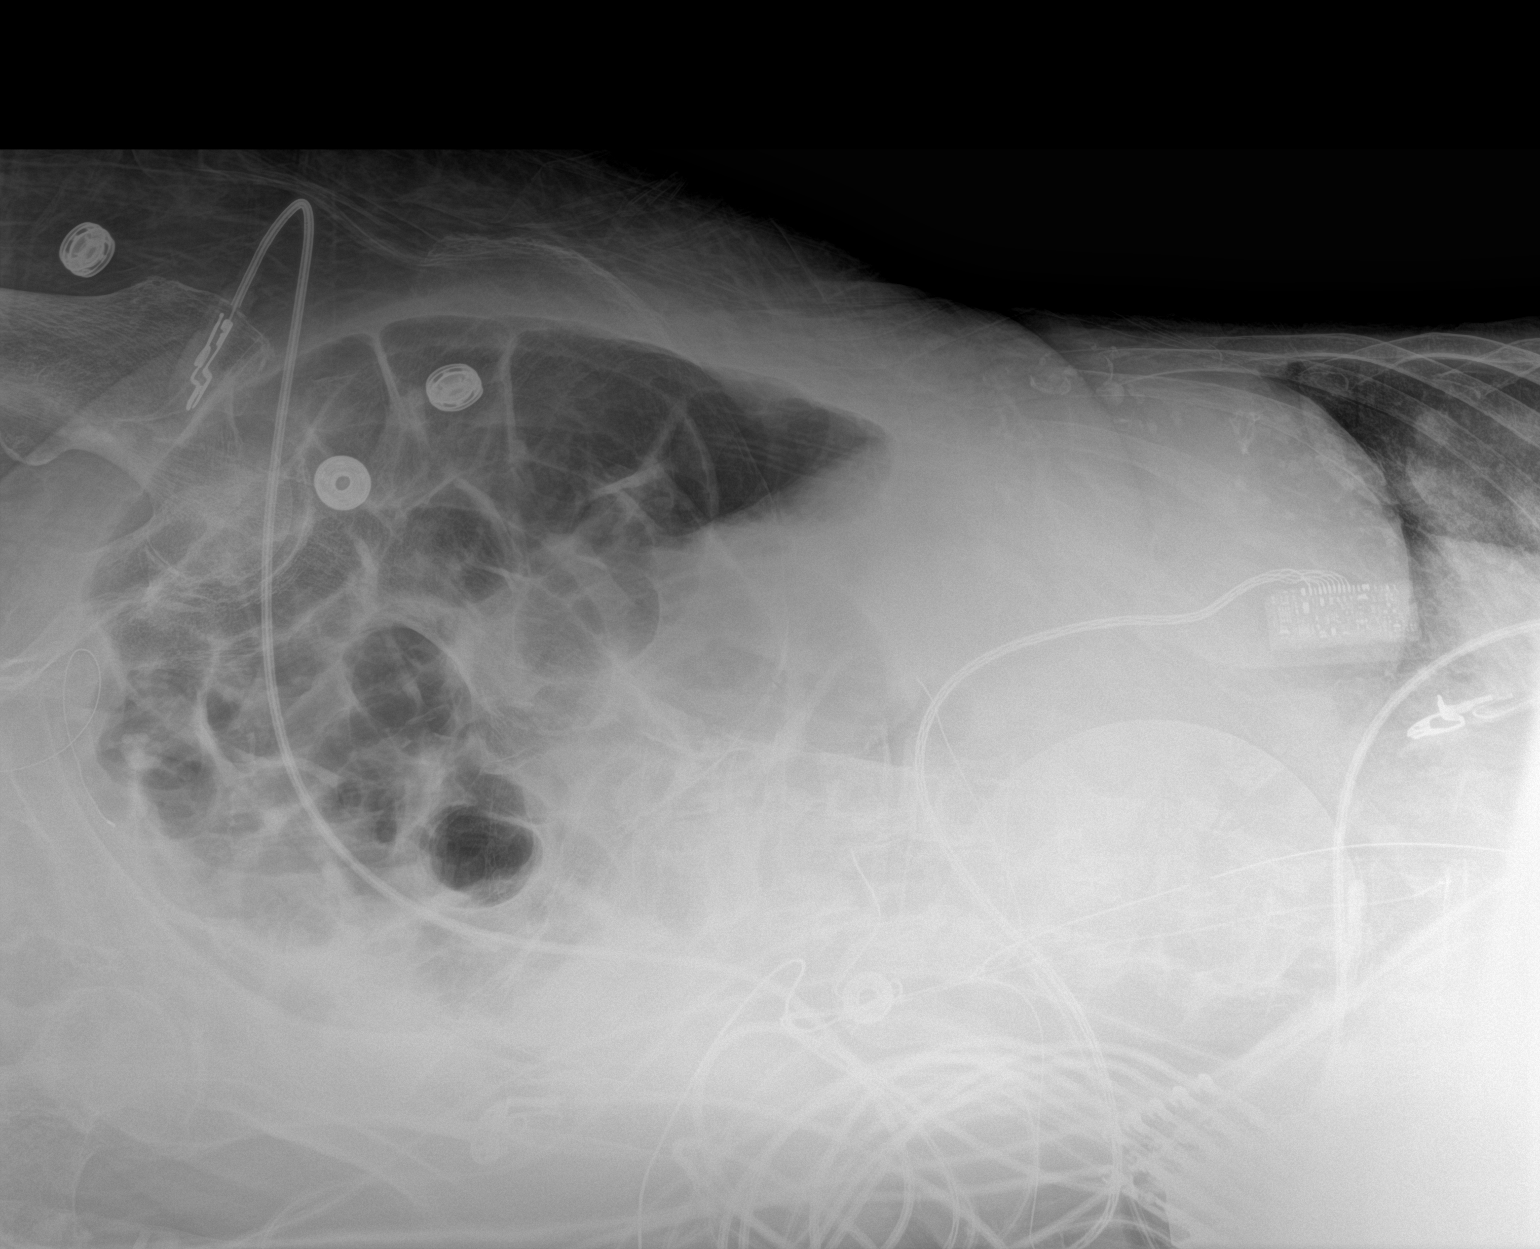

[1 of 1 positions shown; findings below may reference images not displayed]

FINDINGS: Scattered large and small bowel gas is noted. The gastric catheter
is stable and again should be advanced. No free intraperitoneal air
is noted.
IMPRESSION: No evidence of free air is noted.

## 2020-12-04 IMAGING — DX ABDOMEN - 1 VIEW
1 series · 1 of 1 positions shown · non-contrast
Comparison: None.

CLINICAL DATA: Status post NG tube placement.

EXAM:
ABDOMEN - 1 VIEW

[abdomen kub]
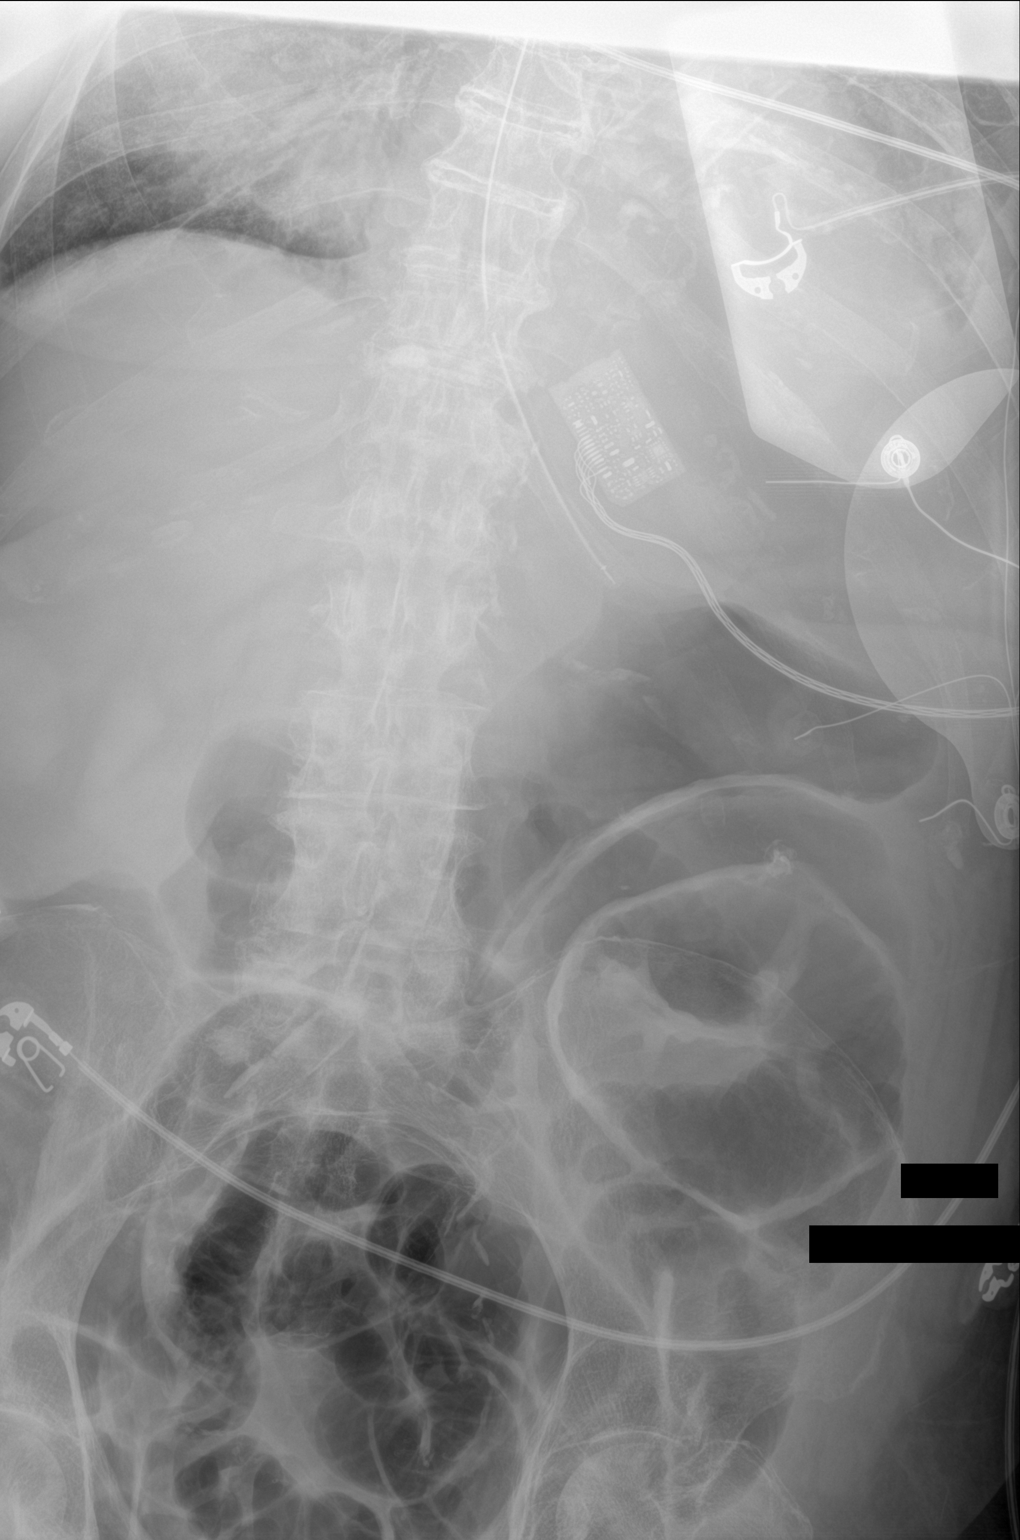

[1 of 1 positions shown; findings below may reference images not displayed]

FINDINGS: NG tube is in place with the side port near the gastroesophageal
junction. The tube should be advanced 2-3 cm. Visualized bowel loops
are dilated. Both walls of loops of small bowel or visible worrisome
for free intraperitoneal air.
IMPRESSION: Findings worrisome for free intraperitoneal air. Left-side-down
decubitus film recommended for further evaluation.

NG tube should be advanced 2-3 cm for better positioning.

Critical Value/emergent results were called by telephone at the time
of interpretation on 03/24/2019 at [DATE] to Dr. YASHECA NIAZI , who
verbally acknowledged these results.
